# Patient Record
Sex: Male | Born: 1980
Health system: Southern US, Community
[De-identification: ages and names within clinical notes are randomized; demographics above are authoritative.]

## PROBLEM LIST (undated history)

## (undated) DIAGNOSIS — Z789 Other specified health status: Secondary | ICD-10-CM

## (undated) HISTORY — PX: WISDOM TOOTH EXTRACTION: SHX21

## (undated) HISTORY — PX: VASECTOMY: SHX75

## (undated) HISTORY — DX: Other specified health status: Z78.9

---

## 2011-02-15 ENCOUNTER — Ambulatory Visit (INDEPENDENT_AMBULATORY_CARE_PROVIDER_SITE_OTHER): Payer: BC Managed Care – PPO

## 2011-02-15 DIAGNOSIS — R509 Fever, unspecified: Secondary | ICD-10-CM

## 2011-02-15 DIAGNOSIS — R0602 Shortness of breath: Secondary | ICD-10-CM

## 2011-02-15 DIAGNOSIS — J209 Acute bronchitis, unspecified: Secondary | ICD-10-CM

## 2016-09-01 DIAGNOSIS — K602 Anal fissure, unspecified: Secondary | ICD-10-CM | POA: Insufficient documentation

## 2018-01-28 DIAGNOSIS — Z302 Encounter for sterilization: Secondary | ICD-10-CM | POA: Diagnosis not present

## 2019-05-19 ENCOUNTER — Other Ambulatory Visit: Payer: Self-pay

## 2019-05-20 ENCOUNTER — Other Ambulatory Visit: Payer: Self-pay

## 2019-05-20 ENCOUNTER — Encounter: Payer: Self-pay | Admitting: Family Medicine

## 2019-05-20 ENCOUNTER — Ambulatory Visit (INDEPENDENT_AMBULATORY_CARE_PROVIDER_SITE_OTHER): Payer: BC Managed Care – PPO | Admitting: Family Medicine

## 2019-05-20 VITALS — BP 120/82 | HR 74 | Temp 98.1°F | Ht 72.0 in | Wt 176.4 lb

## 2019-05-20 DIAGNOSIS — Z1283 Encounter for screening for malignant neoplasm of skin: Secondary | ICD-10-CM | POA: Diagnosis not present

## 2019-05-20 DIAGNOSIS — Z0000 Encounter for general adult medical examination without abnormal findings: Secondary | ICD-10-CM

## 2019-05-20 DIAGNOSIS — Z114 Encounter for screening for human immunodeficiency virus [HIV]: Secondary | ICD-10-CM

## 2019-05-20 DIAGNOSIS — Z125 Encounter for screening for malignant neoplasm of prostate: Secondary | ICD-10-CM | POA: Diagnosis not present

## 2019-05-20 LAB — CBC
HCT: 41.5 % (ref 39.0–52.0)
Hemoglobin: 14.5 g/dL (ref 13.0–17.0)
MCHC: 34.9 g/dL (ref 30.0–36.0)
MCV: 87.6 fl (ref 78.0–100.0)
Platelets: 176 10*3/uL (ref 150.0–400.0)
RBC: 4.74 Mil/uL (ref 4.22–5.81)
RDW: 12.5 % (ref 11.5–15.5)
WBC: 7.5 10*3/uL (ref 4.0–10.5)

## 2019-05-20 LAB — COMPREHENSIVE METABOLIC PANEL
ALT: 19 U/L (ref 0–53)
AST: 20 U/L (ref 0–37)
Albumin: 4.2 g/dL (ref 3.5–5.2)
Alkaline Phosphatase: 40 U/L (ref 39–117)
BUN: 27 mg/dL — ABNORMAL HIGH (ref 6–23)
CO2: 30 mEq/L (ref 19–32)
Calcium: 9 mg/dL (ref 8.4–10.5)
Chloride: 101 mEq/L (ref 96–112)
Creatinine, Ser: 1.31 mg/dL (ref 0.40–1.50)
GFR: 60.39 mL/min (ref >60.00)
Glucose, Bld: 83 mg/dL (ref 70–99)
Potassium: 3.9 mEq/L (ref 3.5–5.1)
Sodium: 138 mEq/L (ref 135–145)
Total Bilirubin: 0.5 mg/dL (ref 0.2–1.2)
Total Protein: 6.7 g/dL (ref 6.0–8.3)

## 2019-05-20 LAB — LIPID PANEL
Cholesterol: 176 mg/dL (ref 0–200)
HDL: 44.1 mg/dL (ref >39.00)
LDL Cholesterol: 99 mg/dL (ref 0–99)
NonHDL: 132.12
Total CHOL/HDL Ratio: 4
Triglycerides: 167 mg/dL — ABNORMAL HIGH (ref 0.0–149.0)
VLDL: 33.4 mg/dL (ref 0.0–40.0)

## 2019-05-20 LAB — PSA: PSA: 2.38 ng/mL (ref 0.10–4.00)

## 2019-05-20 NOTE — Patient Instructions (Addendum)
Give us 2-3 business days to get the results of your labs back.   Keep the diet clean and stay active.  If you do not hear anything about your referral in the next 1-2 weeks, call our office and ask for an update.  Let us know if you need anything. 

## 2019-05-20 NOTE — Progress Notes (Signed)
Chief Complaint  Patient presents with  . New Patient (Initial Visit)    Well Male Tyrone Preston is here for a complete physical.   His last physical was >1 year ago.  Current diet: in general, a "healthy" diet.   Current exercise: running, wt lifting, softball Weight trend: stable Daytime fatigue? No. Seat belt? Yes.    Health maintenance Tetanus- Yes HIV- No  History reviewed. No pertinent past medical history.   History reviewed. No pertinent surgical history.  Medications  No current outpatient medications on file prior to visit.   No current facility-administered medications on file prior to visit.     Allergies No Known Allergies  Family History Family History  Problem Relation Age of Onset  . Hyperlipidemia Mother   . Diabetes Mother   . Cancer Father        prostate cancer  . Cancer Paternal Grandfather        prostate cancer  . Cancer Paternal Uncle         prostate cancer    Review of Systems: Constitutional: no fevers or chills Eye:  no recent significant change in vision Ear/Nose/Mouth/Throat:  Ears:  no hearing loss Nose/Mouth/Throat:  no complaints of nasal congestion, no sore throat Cardiovascular:  no chest pain Respiratory:  no shortness of breath Gastrointestinal:  no abdominal pain, no change in bowel habits GU:  Male: negative for dysuria, frequency, and incontinence Musculoskeletal/Extremities:  no pain of the joints Integumentary (Skin/Breast):  no abnormal skin lesions reported Neurologic:  no headaches Endocrine: No unexpected weight changes Hematologic/Lymphatic:  no night sweats  Exam BP 120/82 (BP Location: Right Arm, Patient Position: Sitting, Cuff Size: Normal)   Pulse 74   Temp 98.1 F (36.7 C) (Temporal)   Ht 6' (1.829 m)   Wt 176 lb 6 oz (80 kg)   SpO2 94%   BMI 23.92 kg/m  General:  well developed, well nourished, in no apparent distress Skin:  no significant moles, warts, or growths Head:  no masses, lesions,  or tenderness Eyes:  pupils equal and round, sclera anicteric without injection Ears:  canals without lesions, TMs shiny without retraction, no obvious effusion, no erythema Nose:  nares patent, septum midline, mucosa normal Throat/Pharynx:  lips and gingiva without lesion; tongue and uvula midline; non-inflamed pharynx; no exudates or postnasal drainage Neck: neck supple without adenopathy, thyromegaly, or masses Lungs:  clear to auscultation, breath sounds equal bilaterally, no respiratory distress Cardio:  regular rate and rhythm, no bruits, no LE edema Abdomen:  abdomen soft, nontender; bowel sounds normal; no masses or organomegaly Rectal: Deferred Musculoskeletal:  symmetrical muscle groups noted without atrophy or deformity Extremities:  no clubbing, cyanosis, or edema, no deformities, no skin discoloration Neuro:  gait normal; deep tendon reflexes normal and symmetric Psych: well oriented with normal range of affect and appropriate judgment/insight  Assessment and Plan  Well adult exam - Plan: CBC, Comprehensive metabolic panel, Lipid panel  Screening for prostate cancer - Plan: PSA  Screening for HIV (human immunodeficiency virus) - Plan: HIV Antibody (routine testing w rflx)  Screening for skin cancer - Plan: Ambulatory referral to Dermatology   Well 39 y.o. male. Counseled on diet and exercise. Counseled on risks and benefits of prostate cancer screening with PSA. The patient agrees to undergo screening. +famhx w dx in mid 83's.  Other orders as above. Follow up in 1 year pending the above workup. The patient voiced understanding and agreement to the plan.  Crosby Oyster [place],  DO 05/20/19 1:57 PM

## 2019-05-21 LAB — HIV ANTIBODY (ROUTINE TESTING W REFLEX): HIV 1&2 Ab, 4th Generation: NONREACTIVE

## 2019-07-05 DIAGNOSIS — L814 Other melanin hyperpigmentation: Secondary | ICD-10-CM | POA: Diagnosis not present

## 2019-07-05 DIAGNOSIS — L812 Freckles: Secondary | ICD-10-CM | POA: Diagnosis not present

## 2019-07-05 DIAGNOSIS — D2272 Melanocytic nevi of left lower limb, including hip: Secondary | ICD-10-CM | POA: Diagnosis not present

## 2019-07-05 DIAGNOSIS — X32XXXS Exposure to sunlight, sequela: Secondary | ICD-10-CM | POA: Diagnosis not present

## 2019-08-02 ENCOUNTER — Other Ambulatory Visit: Payer: Self-pay

## 2019-08-02 ENCOUNTER — Ambulatory Visit (INDEPENDENT_AMBULATORY_CARE_PROVIDER_SITE_OTHER): Payer: BC Managed Care – PPO | Admitting: Medical

## 2019-08-02 ENCOUNTER — Encounter: Payer: Self-pay | Admitting: Medical

## 2019-08-02 VITALS — BP 139/70 | HR 73 | Temp 97.7°F | Resp 18 | Ht 72.0 in | Wt 164.0 lb

## 2019-08-02 DIAGNOSIS — Z87438 Personal history of other diseases of male genital organs: Secondary | ICD-10-CM | POA: Diagnosis not present

## 2019-08-02 DIAGNOSIS — R5383 Other fatigue: Secondary | ICD-10-CM | POA: Diagnosis not present

## 2019-08-02 DIAGNOSIS — R300 Dysuria: Secondary | ICD-10-CM

## 2019-08-02 DIAGNOSIS — R350 Frequency of micturition: Secondary | ICD-10-CM

## 2019-08-02 LAB — POC URINALSYSI DIPSTICK (AUTOMATED)
Bilirubin, UA: NEGATIVE
Blood, UA: NEGATIVE
Glucose, UA: NEGATIVE
Ketones, UA: NEGATIVE
Leukocytes, UA: NEGATIVE
Nitrite, UA: NEGATIVE
Protein, UA: NEGATIVE
Spec Grav, UA: 1.02 (ref 1.010–1.025)
Urobilinogen, UA: 0.2 E.U./dL
pH, UA: 5.5 (ref 5.0–8.0)

## 2019-08-02 MED ORDER — SULFAMETHOXAZOLE-TRIMETHOPRIM 800-160 MG PO TABS
1.0000 | ORAL_TABLET | Freq: Two times a day (BID) | ORAL | 0 refills | Status: DC
Start: 2019-08-02 — End: 2019-08-10

## 2019-08-02 NOTE — Patient Instructions (Addendum)
For hx of prostatitis and probable recurrence recently, I am prescribing Bactrim DS antibiotic.  Antibiotic choices discussed today.  Benefits versus risk.    Recommend rest and hydration.  We will get CBC, CMP, PSA and urine culture today.  Follow-up in 10 to 14 days or as needed.

## 2019-08-02 NOTE — Progress Notes (Signed)
Subjective:    Patient ID: Tyrone Preston, male    DOB: May 17, 1980, 39 y.o.   MRN: [id number]  HPI  Pt in for some urinary symptoms. Pt states he had two bout of prostatitis in his life. He states this feels similar.  At times urinates 10-12 times a day.  Pt states he is urinating very frequently. He describes some suprapubic area pain. Some perineum area pain. No fever, no chills or sweats. Pt states signs/symptosm have for about 7 days.  He states his one bout 5 years ago and another bout  2 years ago.  No hx of diabetes.       Review of Systems  Constitutional: Positive for fatigue. Negative for chills and fever.  Respiratory: Negative for cough, chest tightness, shortness of breath and wheezing.   Cardiovascular: Negative for chest pain and palpitations.  Gastrointestinal: Negative for abdominal pain.  Genitourinary: Positive for frequency. Negative for decreased urine volume, difficulty urinating, penile swelling and urgency.  Musculoskeletal: Negative for back pain.  Neurological: Negative for dizziness and headaches.  Hematological: Negative for adenopathy. Does not bruise/bleed easily.  Psychiatric/Behavioral: Negative for behavioral problems and confusion.   No past medical history on file.   Social History   Socioeconomic History  . Marital status: Married    Spouse name: Not on file  . Number of children: Not on file  . Years of education: Not on file  . Highest education level: Not on file  Occupational History  . Not on file  Tobacco Use  . Smoking status: Never Smoker  . Smokeless tobacco: Never Used  Substance and Sexual Activity  . Alcohol use: Yes    Comment: one or two per week  . Drug use: Not on file  . Sexual activity: Not on file  Other Topics Concern  . Not on file  Social History Narrative  . Not on file   Social Determinants of Health   Financial Resource Strain:   . Difficulty of Paying Living Expenses:   Food Insecurity:   .  Worried About Charity fundraiser in the Last Year:   . Arboriculturist in the Last Year:   Transportation Needs:   . Film/video editor (Medical):   Marland Kitchen Lack of Transportation (Non-Medical):   Physical Activity:   . Days of Exercise per Week:   . Minutes of Exercise per Session:   Stress:   . Feeling of Stress :   Social Connections:   . Frequency of Communication with Friends and Family:   . Frequency of Social Gatherings with Friends and Family:   . Attends Religious Services:   . Active Member of Clubs or Organizations:   . Attends Archivist Meetings:   Marland Kitchen Marital Status:   Intimate Partner Violence:   . Fear of Current or Ex-Partner:   . Emotionally Abused:   Marland Kitchen Physically Abused:   . Sexually Abused:     No past surgical history on file.  Family History  Problem Relation Age of Onset  . Hyperlipidemia Mother   . Diabetes Mother   . Cancer Father        prostate cancer  . Cancer Paternal Grandfather        prostate cancer  . Cancer Paternal Uncle         prostate cancer    No Known Allergies  No current outpatient medications on file prior to visit.   No current facility-administered medications on file prior  to visit.    BP 139/70   Pulse 73   Temp 97.7 F (36.5 C) (Temporal)   Resp 18   Ht 6' (1.829 m)   Wt 164 lb (74.4 kg)   SpO2 100%   BMI 22.24 kg/m       Objective:   Physical Exam  General- No acute distress. Pleasant patient. Neck- Full range of motion, no jvd Lungs- Clear, even and unlabored. Heart- regular rate and rhythm. Neurologic- CNII- XII grossly intact. Abdomen-soft, nontender, nondistended, positive bowel sounds, no rebound or guarding.  No organomegaly.  No suprapubic tenderness to palpation presently. Back-no CVA tenderness.        Assessment & Plan:  For hx of prostatitis and probable recurrence recently, I am prescribing Bactrim DS antibiotic.  Antibiotic choices discussed today.  Benefits versus risk.     Recommend rest and hydration.  We will get CBC, CMP, PSA and urine culture today.  Follow-up in 10 to 14 days or as needed.  Esperanza Richters, PA-C  Time spent with patient today was 20 minutes which consisted of chart review, discussing diagnosis, work up, treatment and documentation.

## 2019-08-03 LAB — URINE CULTURE
MICRO NUMBER:: 10517080
Result:: NO GROWTH
SPECIMEN QUALITY:: ADEQUATE

## 2019-08-03 LAB — COMPREHENSIVE METABOLIC PANEL
ALT: 19 U/L (ref 0–53)
AST: 22 U/L (ref 0–37)
Albumin: 4.5 g/dL (ref 3.5–5.2)
Alkaline Phosphatase: 38 U/L — ABNORMAL LOW (ref 39–117)
BUN: 26 mg/dL — ABNORMAL HIGH (ref 6–23)
CO2: 30 mEq/L (ref 19–32)
Calcium: 9 mg/dL (ref 8.4–10.5)
Chloride: 101 mEq/L (ref 96–112)
Creatinine, Ser: 1.46 mg/dL (ref 0.40–1.50)
GFR: 53.23 mL/min — ABNORMAL LOW (ref >60.00)
Glucose, Bld: 91 mg/dL (ref 70–99)
Potassium: 4.3 mEq/L (ref 3.5–5.1)
Sodium: 138 mEq/L (ref 135–145)
Total Bilirubin: 0.4 mg/dL (ref 0.2–1.2)
Total Protein: 6.7 g/dL (ref 6.0–8.3)

## 2019-08-03 LAB — CBC WITH DIFFERENTIAL/PLATELET
Basophils Absolute: 0 10*3/uL (ref 0.0–0.1)
Basophils Relative: 0.4 % (ref 0.0–3.0)
Eosinophils Absolute: 0.2 10*3/uL (ref 0.0–0.7)
Eosinophils Relative: 2.1 % (ref 0.0–5.0)
HCT: 42.2 % (ref 39.0–52.0)
Hemoglobin: 14.6 g/dL (ref 13.0–17.0)
Lymphocytes Relative: 33.9 % (ref 12.0–46.0)
Lymphs Abs: 2.5 10*3/uL (ref 0.7–4.0)
MCHC: 34.6 g/dL (ref 30.0–36.0)
MCV: 89.8 fl (ref 78.0–100.0)
Monocytes Absolute: 0.6 10*3/uL (ref 0.1–1.0)
Monocytes Relative: 8.2 % (ref 3.0–12.0)
Neutro Abs: 4.2 10*3/uL (ref 1.4–7.7)
Neutrophils Relative %: 55.4 % (ref 43.0–77.0)
Platelets: 148 10*3/uL — ABNORMAL LOW (ref 150.0–400.0)
RBC: 4.7 Mil/uL (ref 4.22–5.81)
RDW: 12.7 % (ref 11.5–15.5)
WBC: 7.5 10*3/uL (ref 4.0–10.5)

## 2019-08-03 LAB — PSA: PSA: 1.25 ng/mL (ref 0.10–4.00)

## 2019-08-10 ENCOUNTER — Other Ambulatory Visit: Payer: Self-pay

## 2019-08-10 ENCOUNTER — Encounter: Payer: Self-pay | Admitting: Family Medicine

## 2019-08-10 ENCOUNTER — Ambulatory Visit (INDEPENDENT_AMBULATORY_CARE_PROVIDER_SITE_OTHER): Payer: BC Managed Care – PPO | Admitting: Family Medicine

## 2019-08-10 VITALS — BP 122/68 | HR 98 | Temp 95.2°F | Ht 72.0 in | Wt 172.0 lb

## 2019-08-10 DIAGNOSIS — N419 Inflammatory disease of prostate, unspecified: Secondary | ICD-10-CM | POA: Diagnosis not present

## 2019-08-10 MED ORDER — CIPROFLOXACIN HCL 500 MG PO TABS: 500.0000 mg | ORAL_TABLET | Freq: Two times a day (BID) | ORAL | 0 refills | Status: DC

## 2019-08-10 NOTE — Patient Instructions (Signed)
Send me a message on Monday if you aren't better and we will set you up with a urologist.  Stay hydrated. Avoid lots of caffeine/alcohol.  Let me know if anything changes.   Let us know if you need anything.

## 2019-08-10 NOTE — Progress Notes (Signed)
Chief Complaint  Patient presents with  . Urinary Urgency    unable to complete urination  . Abdominal Pain    Subjective: Patient is a 39 y.o. male here for urinary issue.  Almost 2 weeks has been having urinary freq, urgency, incomplete emptying, lower abd pain. +hx of prostatitis. Was seen around 9 d ago and rx'd Bactrim that did not help. He was always rx'd Cipro in the past. No inj, fevers, bowel changes, discharge, dysuria.  History reviewed. No pertinent past medical history.  Objective: BP 122/68 (BP Location: Left Arm, Patient Position: Sitting, Cuff Size: Normal)   Pulse 98   Temp (!) 95.2 F (35.1 C) (Temporal)   Ht 6' (1.829 m)   Wt 172 lb (78 kg)   SpO2 100%   BMI 23.33 kg/m  General: Awake, appears stated age HEENT: MMM, EOMi Heart: RRR Abd: BS+, S, mild ttp in suprapubic region, ND GU: No ttp over testicles, no erythema or drainage Lungs: CTAB, no rales, wheezes or rhonchi. No accessory muscle use Psych: Age appropriate judgment and insight, normal affect and mood  Assessment and Plan: Prostatitis, unspecified prostatitis type - Plan: ciprofloxacin (CIPRO) 500 MG tablet  Stop Bactrim. Start 10 d course of bid Cipro. If no improvement in 5 d, he will message and we will refer to urology. If new s/s's arise, he will let us know.  F/u as originally scheduled otherwise.  The patient voiced understanding and agreement to the plan.  Jilda Roche [place], DO 08/10/19  2:21 PM

## 2019-08-19 DIAGNOSIS — R102 Pelvic and perineal pain: Secondary | ICD-10-CM | POA: Diagnosis not present

## 2019-08-19 DIAGNOSIS — R350 Frequency of micturition: Secondary | ICD-10-CM | POA: Diagnosis not present

## 2019-08-19 DIAGNOSIS — N411 Chronic prostatitis: Secondary | ICD-10-CM | POA: Diagnosis not present

## 2019-09-14 DIAGNOSIS — N411 Chronic prostatitis: Secondary | ICD-10-CM | POA: Diagnosis not present

## 2019-09-14 DIAGNOSIS — R350 Frequency of micturition: Secondary | ICD-10-CM | POA: Diagnosis not present

## 2019-09-14 DIAGNOSIS — R102 Pelvic and perineal pain: Secondary | ICD-10-CM | POA: Diagnosis not present

## 2019-09-16 ENCOUNTER — Ambulatory Visit (INDEPENDENT_AMBULATORY_CARE_PROVIDER_SITE_OTHER): Payer: BC Managed Care – PPO | Admitting: Family Medicine

## 2019-09-16 ENCOUNTER — Other Ambulatory Visit: Payer: Self-pay

## 2019-09-16 ENCOUNTER — Encounter: Payer: Self-pay | Admitting: Family Medicine

## 2019-09-16 VITALS — BP 108/68 | HR 70 | Temp 98.0°F | Ht 72.0 in | Wt 167.2 lb

## 2019-09-16 DIAGNOSIS — S46811A Strain of other muscles, fascia and tendons at shoulder and upper arm level, right arm, initial encounter: Secondary | ICD-10-CM | POA: Diagnosis not present

## 2019-09-16 MED ORDER — CYCLOBENZAPRINE HCL 10 MG PO TABS: 5.0000 mg | ORAL_TABLET | Freq: Three times a day (TID) | ORAL | 0 refills | Status: DC | PRN

## 2019-09-16 NOTE — Progress Notes (Signed)
Musculoskeletal Exam  Patient: Tyrone Preston DOB: 11-12-1980  DOS: 09/16/2019  SUBJECTIVE:  Chief Complaint:   Chief Complaint  Patient presents with  . Neck Pain    3 weeks    [name] is a 39 y.o.  male for evaluation and treatment of neck pain.   Onset:  3 weeks ago. Was doing shrugs  Location: R trap Character:  aching and sharp  Progression of issue: Got better then worse again Associated symptoms: no bruising, swelling, redness, ROM of neck better Treatment: to date has been rest, OTC NSAIDS and heat.   Neurovascular symptoms: no  History reviewed. No pertinent past medical history.  Objective: VITAL SIGNS: BP 108/68 (BP Location: Left Arm, Patient Position: Sitting, Cuff Size: Normal)   Pulse 70   Temp 98 F (36.7 C) (Oral)   Ht 6' (1.829 m)   Wt 167 lb 4 oz (75.9 kg)   SpO2 100%   BMI 22.68 kg/m  Constitutional: Well formed, well developed. No acute distress. Thorax & Lungs: No accessory muscle use Musculoskeletal: R trap.   Normal active range of motion: yes.   Normal passive range of motion: yes Tenderness to palpation: yes over medial trap near T2-3 Deformity: no Ecchymosis: no Neurologic: Normal sensory function. No focal deficits noted. DTR's equal and symmetric in UE's. No clonus. 5/5 strength throughout in UE's Psychiatric: Normal mood. Age appropriate judgment and insight. Alert & oriented x 3.    Assessment:  Strain of right trapezius muscle, initial encounter - Plan: cyclobenzaprine (FLEXERIL) 10 MG tablet  Plan: Orders as above. Heat, ice, Tylenol, stretches/exercises. PT in 3-4 weeks if no better. F/u prn. The patient voiced understanding and agreement to the plan.   Tyrone Roche [place], DO 09/16/19  10:14 AM

## 2019-09-16 NOTE — Patient Instructions (Addendum)
Heat (pad or rice pillow in microwave) over affected area, 10-15 minutes twice daily.   Ice/cold pack over area for 10-15 min twice daily.   OK to take Tylenol 1000 mg (2 extra strength tabs) or 975 mg (3 regular strength tabs) every 6 hours as needed.  Take Flexeril (cyclobenzaprine) 1-2 hours before planned bedtime. If it makes you drowsy, do not take during the day. You can try half a tab the following night.   Send me a message in 3-4 weeks if we aren't turning the corner and we will set you up with physical therapy.   Let us know if you need anything.  Trapezius stretches/exercises Do exercises exactly as told by your health care provider and adjust them as directed. It is normal to feel mild stretching, pulling, tightness, or discomfort as you do these exercises, but you should stop right away if you feel sudden pain or your pain gets worse.  Stretching and range of motion exercises These exercises warm up your muscles and joints and improve the movement and flexibility of your shoulder. These exercises can also help to relieve pain, numbness, and tingling. If you are unable to do any of the following for any reason, do not further attempt to do it.   Exercise A: Flexion, standing    1. Stand and hold a broomstick, a cane, or a similar object. Place your hands a little more than shoulder-width apart on the object. Your left / right hand should be palm-up, and your other hand should be palm-down. 2. Push the stick to raise your left / right arm out to your side and then over your head. Use your other hand to help move the stick. Stop when you feel a stretch in your shoulder, or when you reach the angle that is recommended by your health care provider. ? Avoid shrugging your shoulder while you raise your arm. Keep your shoulder blade tucked down toward your spine. 3. Hold for 30 seconds. 4. Slowly return to the starting position. Repeat 2 times. Complete this exercise 3 times per  week.  Exercise B: Abduction, supine    1. Lie on your back and hold a broomstick, a cane, or a similar object. Place your hands a little more than shoulder-width apart on the object. Your left / right hand should be palm-up, and your other hand should be palm-down. 2. Push the stick to raise your left / right arm out to your side and then over your head. Use your other hand to help move the stick. Stop when you feel a stretch in your shoulder, or when you reach the angle that is recommended by your health care provider. ? Avoid shrugging your shoulder while you raise your arm. Keep your shoulder blade tucked down toward your spine. 3. Hold for 30 seconds. 4. Slowly return to the starting position. Repeat 2 times. Complete this exercise 3 times per week.  Exercise C: Flexion, active-assisted    1. Lie on your back. You may bend your knees for comfort. 2. Hold a broomstick, a cane, or a similar object. Place your hands about shoulder-width apart on the object. Your palms should face toward your feet. 3. Raise the stick and move your arms over your head and behind your head, toward the floor. Use your healthy arm to help your left / right arm move farther. Stop when you feel a gentle stretch in your shoulder, or when you reach the angle where your health care provider tells  you to stop. 4. Hold for 30 seconds. 5. Slowly return to the starting position. Repeat 2 times. Complete this exercise 3 times per week.  Exercise D: External rotation and abduction    1. Stand in a door frame with one of your feet slightly in front of the other. This is called a staggered stance. 2. Choose one of the following positions as told by your health care provider: ? Place your hands and forearms on the door frame above your head. ? Place your hands and forearms on the door frame at the height of your head. ? Place your hands on the door frame at the height of your elbows. 3. Slowly move your weight onto  your front foot until you feel a stretch across your chest and in the front of your shoulders. Keep your head and chest upright and keep your abdominal muscles tight. 4. Hold for 30 seconds. 5. To release the stretch, shift your weight to your back foot. Repeat 2 times. Complete this stretch 3 times per week.  Strengthening exercises These exercises build strength and endurance in your shoulder. Endurance is the ability to use your muscles for a long time, even after your muscles get tired. Exercise E: Scapular depression and adduction  1. Sit on a stable chair. Support your arms in front of you with pillows, armrests, or a tabletop. Keep your elbows in line with the sides of your body. 2. Gently move your shoulder blades down toward your middle back. Relax the muscles on the tops of your shoulders and in the back of your neck. 3. Hold for 3 seconds. 4. Slowly release the tension and relax your muscles completely before doing this exercise again. Repeat for a total of 10 repetitions. 5. After you have practiced this exercise, try doing the exercise without the arm support. Then, try the exercise while standing instead of sitting. Repeat 2 times. Complete this exercise 3 times per week.  Exercise F: Shoulder abduction, isometric    1. Stand or sit about 4-6 inches (10-15 cm) from a wall with your left / right side facing the wall. 2. Bend your left / right elbow and gently press your elbow against the wall. 3. Increase the pressure slowly until you are pressing as hard as you can without shrugging your shoulder. 4. Hold for 3 seconds. 5. Slowly release the tension and relax your muscles completely. Repeat for a total of 10 repetitions. Repeat 2 times. Complete this exercise 3 times per week.  Exercise G: Shoulder flexion, isometric    1. Stand or sit about 4-6 inches (10-15 cm) away from a wall with your left / right side facing the wall. 2. Keep your left / right elbow straight and  gently press the top of your fist against the wall. Increase the pressure slowly until you are pressing as hard as you can without shrugging your shoulder. 3. Hold for 10-15 seconds. 4. Slowly release the tension and relax your muscles completely. Repeat for a total of 10 repetitions. Repeat 2 times. Complete this exercise 3 times per week.  Exercise H: Internal rotation    1. Sit in a stable chair without armrests, or stand. Secure an exercise band at your left / right side, at elbow height. 2. Place a soft object, such as a folded towel or a small pillow, under your left / right upper arm so your elbow is a few inches (about 8 cm) away from your side. 3. Hold the end of  the exercise band so the band stretches. 4. Keeping your elbow pressed against the soft object under your arm, move your forearm across your body toward your abdomen. Keep your body steady so the movement is only coming from your shoulder. 5. Hold for 3 seconds. 6. Slowly return to the starting position. Repeat for a total of 10 repetitions. Repeat 2 times. Complete this exercise 3 times per week.  Exercise I: External rotation    1. Sit in a stable chair without armrests, or stand. 2. Secure an exercise band at your left / right side, at elbow height. 3. Place a soft object, such as a folded towel or a small pillow, under your left / right upper arm so your elbow is a few inches (about 8 cm) away from your side. 4. Hold the end of the exercise band so the band stretches. 5. Keeping your elbow pressed against the soft object under your arm, move your forearm out, away from your abdomen. Keep your body steady so the movement is only coming from your shoulder. 6. Hold for 3 seconds. 7. Slowly return to the starting position. Repeat for a total of 10 repetitions. Repeat 2 times. Complete this exercise 3 times per week. Exercise J: Shoulder extension  1. Sit in a stable chair without armrests, or stand. Secure an exercise  band to a stable object in front of you so the band is at shoulder height. 2. Hold one end of the exercise band in each hand. Your palms should face each other. 3. Straighten your elbows and lift your hands up to shoulder height. 4. Step back, away from the secured end of the exercise band, until the band stretches. 5. Squeeze your shoulder blades together and pull your hands down to the sides of your thighs. Stop when your hands are straight down by your sides. Do not let your hands go behind your body. 6. Hold for 3 seconds. 7. Slowly return to the starting position. Repeat for a total of 10 repetitions. Repeat 2 times. Complete this exercise 3 times per week.  Exercise K: Shoulder extension, prone    1. Lie on your abdomen on a firm surface so your left / right arm hangs over the edge. 2. Hold a 5 lb weight in your hand so your palm faces in toward your body. Your arm should be straight. 3. Squeeze your shoulder blade down toward the middle of your back. 4. Slowly raise your arm behind you, up to the height of the surface that you are lying on. Keep your arm straight. 5. Hold for 3 seconds. 6. Slowly return to the starting position and relax your muscles. Repeat for a total of 10 repetitions. Repeat 2 times. Complete this exercise 3 times per week.   Exercise L: Horizontal abduction, prone  1. Lie on your abdomen on a firm surface so your left / right arm hangs over the edge. 2. Hold a 5 lb weight in your hand so your palm faces toward your feet. Your arm should be straight. 3. Squeeze your shoulder blade down toward the middle of your back. 4. Bend your elbow so your hand moves up, until your elbow is bent to an "L" shape (90 degrees). With your elbow bent, slowly move your forearm forward and up. Raise your hand up to the height of the surface that you are lying on. ? Your upper arm should not move, and your elbow should stay bent. ? At the top of the movement,  your palm should face  the floor. 5. Hold for 3 seconds. 6. Slowly return to the starting position and relax your muscles. Repeat for a total of 10 repetitions. Repeat 2 times. Complete this exercise 3 times per week.  Exercise M: Horizontal abduction, standing  1. Sit on a stable chair, or stand. 2. Secure an exercise band to a stable object in front of you so the band is at shoulder height. 3. Hold one end of the exercise band in each hand. 4. Straighten your elbows and lift your hands straight in front of you, up to shoulder height. Your palms should face down, toward the floor. 5. Step back, away from the secured end of the exercise band, until the band stretches. 6. Move your arms out to your sides, and keep your arms straight. 7. Hold for 3 seconds. 8. Slowly return to the starting position. Repeat for a total of 10 repetitions. Repeat 2 times. Complete this exercise 3 times per week.  Exercise N: Scapular retraction and elevation  1. Sit on a stable chair, or stand. 2. Secure an exercise band to a stable object in front of you so the band is at shoulder height. 3. Hold one end of the exercise band in each hand. Your palms should face each other. 4. Sit in a stable chair without armrests, or stand. 5. Step back, away from the secured end of the exercise band, until the band stretches. 6. Squeeze your shoulder blades together and lift your hands over your head. Keep your elbows straight. 7. Hold for 3 seconds. 8. Slowly return to the starting position. Repeat for a total of 10 repetitions. Repeat 2 times. Complete this exercise 3 times per week.  This information is not intended to replace advice given to you by your health care provider. Make sure you discuss any questions you have with your health care provider. Document Released: 02/24/2005 Document Revised: 11/01/2015 Document Reviewed: 01/11/2015 Elsevier Interactive Patient Education  2017 [employer].

## 2019-09-22 DIAGNOSIS — N411 Chronic prostatitis: Secondary | ICD-10-CM | POA: Diagnosis not present

## 2019-09-22 DIAGNOSIS — M62838 Other muscle spasm: Secondary | ICD-10-CM | POA: Diagnosis not present

## 2019-09-22 DIAGNOSIS — M6289 Other specified disorders of muscle: Secondary | ICD-10-CM | POA: Diagnosis not present

## 2019-09-22 DIAGNOSIS — M6281 Muscle weakness (generalized): Secondary | ICD-10-CM | POA: Diagnosis not present

## 2019-10-10 DIAGNOSIS — N411 Chronic prostatitis: Secondary | ICD-10-CM | POA: Diagnosis not present

## 2019-10-10 DIAGNOSIS — R350 Frequency of micturition: Secondary | ICD-10-CM | POA: Diagnosis not present

## 2019-10-10 DIAGNOSIS — Z302 Encounter for sterilization: Secondary | ICD-10-CM | POA: Diagnosis not present

## 2019-10-10 DIAGNOSIS — R102 Pelvic and perineal pain: Secondary | ICD-10-CM | POA: Diagnosis not present

## 2019-10-31 DIAGNOSIS — R102 Pelvic and perineal pain: Secondary | ICD-10-CM | POA: Diagnosis not present

## 2019-10-31 DIAGNOSIS — M6289 Other specified disorders of muscle: Secondary | ICD-10-CM | POA: Diagnosis not present

## 2019-10-31 DIAGNOSIS — M6281 Muscle weakness (generalized): Secondary | ICD-10-CM | POA: Diagnosis not present

## 2019-10-31 DIAGNOSIS — M62838 Other muscle spasm: Secondary | ICD-10-CM | POA: Diagnosis not present

## 2019-11-23 DIAGNOSIS — Z20822 Contact with and (suspected) exposure to covid-19: Secondary | ICD-10-CM | POA: Diagnosis not present

## 2019-12-08 DIAGNOSIS — B078 Other viral warts: Secondary | ICD-10-CM | POA: Diagnosis not present

## 2019-12-08 DIAGNOSIS — L821 Other seborrheic keratosis: Secondary | ICD-10-CM | POA: Diagnosis not present

## 2019-12-13 DIAGNOSIS — R102 Pelvic and perineal pain: Secondary | ICD-10-CM | POA: Diagnosis not present

## 2019-12-13 DIAGNOSIS — R1084 Generalized abdominal pain: Secondary | ICD-10-CM | POA: Diagnosis not present

## 2020-04-18 ENCOUNTER — Ambulatory Visit (INDEPENDENT_AMBULATORY_CARE_PROVIDER_SITE_OTHER): Payer: BC Managed Care – PPO | Admitting: Family Medicine

## 2020-04-18 ENCOUNTER — Encounter: Payer: Self-pay | Admitting: Family Medicine

## 2020-04-18 ENCOUNTER — Other Ambulatory Visit: Payer: Self-pay

## 2020-04-18 VITALS — BP 110/76 | HR 59 | Temp 98.2°F | Ht 72.0 in | Wt 180.0 lb

## 2020-04-18 DIAGNOSIS — Z125 Encounter for screening for malignant neoplasm of prostate: Secondary | ICD-10-CM | POA: Diagnosis not present

## 2020-04-18 DIAGNOSIS — Z0000 Encounter for general adult medical examination without abnormal findings: Secondary | ICD-10-CM

## 2020-04-18 DIAGNOSIS — Z1159 Encounter for screening for other viral diseases: Secondary | ICD-10-CM

## 2020-04-18 DIAGNOSIS — Z1331 Encounter for screening for depression: Secondary | ICD-10-CM

## 2020-04-18 NOTE — Progress Notes (Signed)
Chief Complaint  Patient presents with  . Annual Exam    Well Male Tyrone Preston is here for a complete physical.   His last physical was >1 year ago.  Current diet: in general, a "healthy" diet.   Current exercise: lifting, cardio, stretching Weight trend: stable Fatigue out of ordinary? No. Seat belt? Yes.   Loss of interested in doing things or depression in past 2 weeks? No  Health maintenance Tetanus- Yes HIV- Yes Hep C- No  Past Medical History:  Diagnosis Date  . No known health problems      History reviewed. No pertinent surgical history.  Medications  Takes no meds routinely.    Allergies No Known Allergies  Family History Family History  Problem Relation Age of Onset  . Hyperlipidemia Mother   . Diabetes Mother   . Cancer Father        prostate cancer  . Cancer Paternal Grandfather        prostate cancer  . Cancer Paternal Uncle         prostate cancer    Review of Systems: Constitutional: no fevers or chills Eye:  no recent significant change in vision Ear/Nose/Mouth/Throat:  Ears:  no hearing loss Nose/Mouth/Throat:  no complaints of nasal congestion, no sore throat Cardiovascular:  no chest pain Respiratory:  no shortness of breath Gastrointestinal:  no abdominal pain, no change in bowel habits GU:  Male: negative for dysuria, frequency, and incontinence Musculoskeletal/Extremities:  No new pain of the joints Integumentary (Skin/Breast):  no abnormal skin lesions reported Neurologic:  no headaches Endocrine: No unexpected weight changes Hematologic/Lymphatic:  no night sweats  Exam BP 110/76 (BP Location: Left Arm, Patient Position: Sitting, Cuff Size: Normal)   Pulse (!) 59   Temp 98.2 F (36.8 C) (Oral)   Ht 6' (1.829 m)   Wt 180 lb (81.6 kg)   SpO2 99%   BMI 24.41 kg/m  General:  well developed, well nourished, in no apparent distress Skin:  no significant moles, warts, or growths Head:  no masses, lesions, or  tenderness Eyes:  pupils equal and round, sclera anicteric without injection Ears:  canals without lesions, TMs shiny without retraction, no obvious effusion, no erythema Nose:  nares patent, septum midline, mucosa normal Throat/Pharynx:  lips and gingiva without lesion; tongue and uvula midline; non-inflamed pharynx; no exudates or postnasal drainage Neck: neck supple without adenopathy, thyromegaly, or masses Lungs:  clear to auscultation, breath sounds equal bilaterally, no respiratory distress Cardio:  regular rate and rhythm, no bruits, no LE edema Abdomen:  abdomen soft, nontender; bowel sounds normal; no masses or organomegaly Rectal: Deferred Musculoskeletal:  symmetrical muscle groups noted without atrophy or deformity Extremities:  no clubbing, cyanosis, or edema, no deformities, no skin discoloration Neuro:  gait normal; deep tendon reflexes normal and symmetric Psych: well oriented with normal range of affect and appropriate judgment/insight  Assessment and Plan  Well adult exam - Plan: Lipid panel, Comprehensive metabolic panel  Screening for prostate cancer - Plan: PSA  Encounter for hepatitis C screening test for low risk patient - Plan: Hepatitis C antibody  Negative depression screening   Well 40 y.o. male. Counseled on diet and exercise. Counseled on risks and benefits of prostate cancer screening with PSA. The patient agrees to undergo screening.   Declines covid vaccination. I rec'd he does get it. He will let me know if he has questions about it.  Other orders as above. Follow up in 1 yr pending the  above workup. The patient voiced understanding and agreement to the plan.  Jilda Roche [place], DO 04/18/20 2:57 PM

## 2020-04-18 NOTE — Patient Instructions (Signed)
Give [place] 2-3 business days to get the results of your labs back.   Keep the diet clean and stay active.  I do recommend you get the COVID-19 vaccination, either Pfizer or Moderna. Please don't hesitate to reach out if you have any questions.   Let us know if you need anything.

## 2020-04-19 LAB — COMPREHENSIVE METABOLIC PANEL
ALT: 19 U/L (ref 0–53)
AST: 21 U/L (ref 0–37)
Albumin: 4.5 g/dL (ref 3.5–5.2)
Alkaline Phosphatase: 42 U/L (ref 39–117)
BUN: 27 mg/dL — ABNORMAL HIGH (ref 6–23)
CO2: 30 mEq/L (ref 19–32)
Calcium: 9.4 mg/dL (ref 8.4–10.5)
Chloride: 102 mEq/L (ref 96–112)
Creatinine, Ser: 1.36 mg/dL (ref 0.40–1.50)
GFR: 64.59 mL/min (ref >60.00)
Glucose, Bld: 85 mg/dL (ref 70–99)
Potassium: 3.8 mEq/L (ref 3.5–5.1)
Sodium: 139 mEq/L (ref 135–145)
Total Bilirubin: 0.4 mg/dL (ref 0.2–1.2)
Total Protein: 6.9 g/dL (ref 6.0–8.3)

## 2020-04-19 LAB — LIPID PANEL
Cholesterol: 199 mg/dL (ref 0–200)
HDL: 47.4 mg/dL (ref >39.00)
LDL Cholesterol: 119 mg/dL — ABNORMAL HIGH (ref 0–99)
NonHDL: 151.66
Total CHOL/HDL Ratio: 4
Triglycerides: 162 mg/dL — ABNORMAL HIGH (ref 0.0–149.0)
VLDL: 32.4 mg/dL (ref 0.0–40.0)

## 2020-04-19 LAB — PSA: PSA: 2.13 ng/mL (ref 0.10–4.00)

## 2020-04-19 LAB — HEPATITIS C ANTIBODY
Hepatitis C Ab: NONREACTIVE
SIGNAL TO CUT-OFF: 0.01 (ref <1.00)

## 2020-05-16 DIAGNOSIS — M6281 Muscle weakness (generalized): Secondary | ICD-10-CM | POA: Diagnosis not present

## 2020-05-16 DIAGNOSIS — R102 Pelvic and perineal pain: Secondary | ICD-10-CM | POA: Diagnosis not present

## 2020-05-16 DIAGNOSIS — M62838 Other muscle spasm: Secondary | ICD-10-CM | POA: Diagnosis not present

## 2020-05-16 DIAGNOSIS — R350 Frequency of micturition: Secondary | ICD-10-CM | POA: Diagnosis not present

## 2020-06-06 DIAGNOSIS — R102 Pelvic and perineal pain: Secondary | ICD-10-CM | POA: Diagnosis not present

## 2020-06-06 DIAGNOSIS — R350 Frequency of micturition: Secondary | ICD-10-CM | POA: Diagnosis not present

## 2020-06-06 DIAGNOSIS — M62838 Other muscle spasm: Secondary | ICD-10-CM | POA: Diagnosis not present

## 2020-06-06 DIAGNOSIS — M6281 Muscle weakness (generalized): Secondary | ICD-10-CM | POA: Diagnosis not present

## 2020-07-03 DIAGNOSIS — L814 Other melanin hyperpigmentation: Secondary | ICD-10-CM | POA: Diagnosis not present

## 2020-07-03 DIAGNOSIS — D1801 Hemangioma of skin and subcutaneous tissue: Secondary | ICD-10-CM | POA: Diagnosis not present

## 2020-07-03 DIAGNOSIS — X32XXXS Exposure to sunlight, sequela: Secondary | ICD-10-CM | POA: Diagnosis not present

## 2020-07-03 DIAGNOSIS — D2272 Melanocytic nevi of left lower limb, including hip: Secondary | ICD-10-CM | POA: Diagnosis not present

## 2020-08-28 DIAGNOSIS — N411 Chronic prostatitis: Secondary | ICD-10-CM | POA: Diagnosis not present

## 2020-08-28 DIAGNOSIS — M6281 Muscle weakness (generalized): Secondary | ICD-10-CM | POA: Diagnosis not present

## 2020-08-28 DIAGNOSIS — R350 Frequency of micturition: Secondary | ICD-10-CM | POA: Diagnosis not present

## 2020-08-28 DIAGNOSIS — R102 Pelvic and perineal pain: Secondary | ICD-10-CM | POA: Diagnosis not present

## 2021-04-19 ENCOUNTER — Encounter: Payer: Self-pay | Admitting: Family Medicine

## 2021-04-19 ENCOUNTER — Ambulatory Visit (INDEPENDENT_AMBULATORY_CARE_PROVIDER_SITE_OTHER): Payer: BC Managed Care – PPO | Admitting: Family Medicine

## 2021-04-19 VITALS — BP 120/70 | HR 61 | Temp 98.6°F | Ht 72.0 in | Wt 175.4 lb

## 2021-04-19 DIAGNOSIS — Z125 Encounter for screening for malignant neoplasm of prostate: Secondary | ICD-10-CM | POA: Diagnosis not present

## 2021-04-19 DIAGNOSIS — Z0000 Encounter for general adult medical examination without abnormal findings: Secondary | ICD-10-CM | POA: Diagnosis not present

## 2021-04-19 DIAGNOSIS — M79601 Pain in right arm: Secondary | ICD-10-CM | POA: Diagnosis not present

## 2021-04-19 NOTE — Progress Notes (Signed)
Chief Complaint  Patient presents with   Annual Exam    Tyrone Preston Tyrone Preston is here for a complete physical.   His last physical was >1 year ago.  Current diet: in general, a "healthy" diet.   Current exercise: lifting, cardio Weight trend: stable Fatigue out of ordinary? No. Seat belt? Yes.   Advanced directive? No  Health maintenance Tetanus- Yes HIV- Yes Hep C- Yes  R shoulder/upper arm pain Pain for nearly 1 yr, no specific inj or change in activity. No bruising, swelling, redness. Press exercises and reaching back causes pain. No catching/locking.   Past Medical History:  Diagnosis Date   No known health problems      Past Surgical History:  Procedure Laterality Date   VASECTOMY     WISDOM TOOTH EXTRACTION      Medications  Takes no meds routinely.     Allergies No Known Allergies  Family History Family History  Problem Relation Age of Onset   Hyperlipidemia Mother    Diabetes Mother    Cancer Father        prostate cancer   Cancer Paternal Grandfather        prostate cancer   Cancer Paternal Uncle         prostate cancer    Review of Systems: Constitutional: no fevers or chills Eye:  no recent significant change in vision Ear/Nose/Mouth/Throat:  Ears:  no hearing loss Nose/Mouth/Throat:  no complaints of nasal congestion, no sore throat Cardiovascular:  no chest pain Respiratory:  no shortness of breath Gastrointestinal:  no abdominal pain, no change in bowel habits GU:  Preston: negative for dysuria, frequency, and incontinence Musculoskeletal/Extremities:  +arm pain Integumentary (Skin/Breast):  no abnormal skin lesions reported Neurologic:  no headaches Endocrine: No unexpected weight changes Hematologic/Lymphatic:  no night sweats  Exam BP 120/70    Pulse 61    Temp 98.6 F (37 C) (Oral)    Ht 6' (1.829 m)    Wt 175 lb 6 oz (79.5 kg)    SpO2 99%    BMI 23.79 kg/m  General:  Tyrone developed, Tyrone nourished, in no apparent  distress Skin:  no significant moles, warts, or growths Head:  no masses, lesions, or tenderness Eyes:  pupils equal and round, sclera anicteric without injection Ears:  canals without lesions, TMs shiny without retraction, no obvious effusion, no erythema Nose:  nares patent, septum midline, mucosa normal Throat/Pharynx:  lips and gingiva without lesion; tongue and uvula midline; non-inflamed pharynx; no exudates or postnasal drainage Neck: neck supple without adenopathy, thyromegaly, or masses Lungs:  clear to auscultation, breath sounds equal bilaterally, no respiratory distress Cardio:  regular rate and rhythm, no bruits, no LE edema Abdomen:  abdomen soft, nontender; bowel sounds normal; no masses or organomegaly Rectal: Deferred Musculoskeletal:  RUE: No ttp, nml active/passive ROM, no ecchymosis or deformity, neg Neer's, Hawkins, lift off, cross over, Obriens, Speed's, Empty can caused pain but no weakness Extremities:  no clubbing, cyanosis, or edema, no deformities, no skin discoloration Neuro:  gait normal; deep tendon reflexes normal and symmetric Psych: Tyrone oriented with normal range of affect and appropriate judgment/insight  Assessment and Plan  Tyrone adult exam - Plan: CBC, Comprehensive metabolic panel, Lipid panel  Screening for prostate cancer - Plan: PSA  Right arm pain - Plan: Ambulatory referral to Sports Medicine   Tyrone Preston. Counseled on diet and exercise. Counseled on risks and benefits of prostate cancer screening with PSA. The  patient agrees to undergo screening.  R arm pain: Will refer to sports med, heat, ice, Tylenol, stretches/exercises for shoulder region.  Advanced directive form provided today.  Other orders as above. Follow up in 1 yr pending the above workup. The patient voiced understanding and agreement to the plan.  Jilda Roche [place], DO 04/19/21 2:58 PM

## 2021-04-19 NOTE — Patient Instructions (Addendum)
Give [place] 2-3 business days to get the results of your labs back.   Keep the diet clean and stay active.  If you do not hear anything about your referral in the next 1-2 weeks, call our office and ask for an update.  Heat (pad or rice pillow in microwave) over affected area, 10-15 minutes twice daily.   Ice/cold pack over area for 10-15 min twice daily.  Let us know if you need anything.  EXERCISES  RANGE OF MOTION (ROM) AND STRETCHING EXERCISES These exercises may help you when beginning to rehabilitate your injury. While completing these exercises, remember:  Restoring tissue flexibility helps normal motion to return to the joints. This allows healthier, less painful movement and activity. An effective stretch should be held for at least 30 seconds. A stretch should never be painful. You should only feel a gentle lengthening or release in the stretched tissue.  ROM - Pendulum Bend at the waist so that your right / left arm falls away from your body. Support yourself with your opposite hand on a solid surface, such as a table or a countertop. Your right / left arm should be perpendicular to the ground. If it is not perpendicular, you need to lean over farther. Relax the muscles in your right / left arm and shoulder as much as possible. Gently sway your hips and trunk so they move your right / left arm without any use of your right / left shoulder muscles. Progress your movements so that your right / left arm moves side to side, then forward and backward, and finally, both clockwise and counterclockwise. Complete 10-15 repetitions in each direction. Many people use this exercise to relieve discomfort in their shoulder as well as to gain range of motion. Repeat 2 times. Complete this exercise 3 times per week.  STRETCH - Flexion, Standing Stand with good posture. With an underhand grip on your right / left hand and an overhand grip on the opposite hand, grasp a broomstick or cane so that  your hands are a little more than shoulder-width apart. Keeping your right / left elbow straight and shoulder muscles relaxed, push the stick with your opposite hand to raise your right / left arm in front of your body and then overhead. Raise your arm until you feel a stretch in your right / left shoulder, but before you have increased shoulder pain. Try to avoid shrugging your right / left shoulder as your arm rises by keeping your shoulder blade tucked down and toward your mid-back spine. Hold 30 seconds. Slowly return to the starting position. Repeat 2 times. Complete this exercise 3 times per week.  STRETCH - Internal Rotation Place your right / left hand behind your back, palm-up. Throw a towel or belt over your opposite shoulder. Grasp the towel/belt with your right / left hand. While keeping an upright posture, gently pull up on the towel/belt until you feel a stretch in the front of your right / left shoulder. Avoid shrugging your right / left shoulder as your arm rises by keeping your shoulder blade tucked down and toward your mid-back spine. Hold 30. Release the stretch by lowering your opposite hand. Repeat 2 times. Complete this exercise 3 times per week.  STRETCH - External Rotation and Abduction Stagger your stance through a doorframe. It does not matter which foot is forward. As instructed by your physician, physical therapist or athletic trainer, place your hands: And forearms above your head and on the door frame. And  forearms at head-height and on the door frame. At elbow-height and on the door frame. Keeping your head and chest upright and your stomach muscles tight to prevent over-extending your low-back, slowly shift your weight onto your front foot until you feel a stretch across your chest and/or in the front of your shoulders. Hold 30 seconds. Shift your weight to your back foot to release the stretch. Repeat 2 times. Complete this stretch 3 times per week.    STRENGTHENING EXERCISES  These exercises may help you when beginning to rehabilitate your injury. They may resolve your symptoms with or without further involvement from your physician, physical therapist or athletic trainer. While completing these exercises, remember:  Muscles can gain both the endurance and the strength needed for everyday activities through controlled exercises. Complete these exercises as instructed by your physician, physical therapist or athletic trainer. Progress the resistance and repetitions only as guided. You may experience muscle soreness or fatigue, but the pain or discomfort you are trying to eliminate should never worsen during these exercises. If this pain does worsen, stop and make certain you are following the directions exactly. If the pain is still present after adjustments, discontinue the exercise until you can discuss the trouble with your clinician. If advised by your physician, during your recovery, avoid activity or exercises which involve actions that place your right / left hand or elbow above your head or behind your back or head. These positions stress the tissues which are trying to heal.  STRENGTH - Scapular Depression and Adduction With good posture, sit on a firm chair. Supported your arms in front of you with pillows, arm rests or a table top. Have your elbows in line with the sides of your body. Gently draw your shoulder blades down and toward your mid-back spine. Gradually increase the tension without tensing the muscles along the top of your shoulders and the back of your neck. Hold for 3 seconds. Slowly release the tension and relax your muscles completely before completing the next repetition. After you have practiced this exercise, remove the arm support and complete it in standing as well as sitting. Repeat 2 times. Complete this exercise 3 times per week.   STRENGTH - External Rotators Secure a rubber exercise band/tubing to a fixed  object so that it is at the same height as your right / left elbow when you are standing or sitting on a firm surface. Stand or sit so that the secured exercise band/tubing is at your side that is not injured. Bend your elbow 90 degrees. Place a folded towel or small pillow under your right / left arm so that your elbow is a few inches away from your side. Keeping the tension on the exercise band/tubing, pull it away from your body, as if pivoting on your elbow. Be sure to keep your body steady so that the movement is only coming from your shoulder rotating. Hold 3 seconds. Release the tension in a controlled manner as you return to the starting position. Repeat 2 times. Complete this exercise 3 times per week.   STRENGTH - Supraspinatus Stand or sit with good posture. Grasp a 2-3 lb weight or an exercise band/tubing so that your hand is "thumbs-up," like when you shake hands. Slowly lift your right / left hand from your thigh into the air, traveling about 30 degrees from straight out at your side. Lift your hand to shoulder height or as far as you can without increasing any shoulder pain. Initially, many  people do not lift their hands above shoulder height. Avoid shrugging your right / left shoulder as your arm rises by keeping your shoulder blade tucked down and toward your mid-back spine. Hold for 3 seconds. Control the descent of your hand as you slowly return to your starting position. Repeat 2 times. Complete this exercise 3 times per week.   STRENGTH - Shoulder Extensors Secure a rubber exercise band/tubing so that it is at the height of your shoulders when you are either standing or sitting on a firm arm-less chair. With a thumbs-up grip, grasp an end of the band/tubing in each hand. Straighten your elbows and lift your hands straight in front of you at shoulder height. Step back away from the secured end of band/tubing until it becomes tense. Squeezing your shoulder blades together, pull  your hands down to the sides of your thighs. Do not allow your hands to go behind you. Hold for 3 seconds. Slowly ease the tension on the band/tubing as you reverse the directions and return to the starting position. Repeat 2 times. Complete this exercise 3 times per week.   STRENGTH - Scapular Retractors Secure a rubber exercise band/tubing so that it is at the height of your shoulders when you are either standing or sitting on a firm arm-less chair. With a palm-down grip, grasp an end of the band/tubing in each hand. Straighten your elbows and lift your hands straight in front of you at shoulder height. Step back away from the secured end of band/tubing until it becomes tense. Squeezing your shoulder blades together, draw your elbows back as you bend them. Keep your upper arm lifted away from your body throughout the exercise. Hold 3 seconds. Slowly ease the tension on the band/tubing as you reverse the directions and return to the starting position. Repeat 2 times. Complete this exercise 3 times per week.  STRENGTH - Scapular Depressors Find a sturdy chair without wheels, such as a from a dining room table. Keeping your feet on the floor, lift your bottom from the seat and lock your elbows. Keeping your elbows straight, allow gravity to pull your body weight down. Your shoulders will rise toward your ears. Raise your body against gravity by drawing your shoulder blades down your back, shortening the distance between your shoulders and ears. Although your feet should always maintain contact with the floor, your feet should progressively support less body weight as you get stronger. Hold 3 seconds. In a controlled and slow manner, lower your body weight to begin the next repetition. Repeat 2 times. Complete this exercise 3 times per week.    This information is not intended to replace advice given to you by your health care provider. Make sure you discuss any questions you have with your health  care provider.   Document Released: 01/08/2005 Document Revised: 03/17/2014 Document Reviewed: 06/08/2008 Elsevier Interactive Patient Education [employer].

## 2021-04-20 LAB — LIPID PANEL
Cholesterol: 211 mg/dL — ABNORMAL HIGH (ref <200)
HDL: 53 mg/dL (ref >=40)
LDL Cholesterol (Calc): 131 mg/dL (calc) — ABNORMAL HIGH
Non-HDL Cholesterol (Calc): 158 mg/dL (calc) — ABNORMAL HIGH (ref <130)
Total CHOL/HDL Ratio: 4 (calc) (ref <5.0)
Triglycerides: 158 mg/dL — ABNORMAL HIGH (ref <150)

## 2021-04-20 LAB — COMPREHENSIVE METABOLIC PANEL
AG Ratio: 1.8 (calc) (ref 1.0–2.5)
ALT: 28 U/L (ref 9–46)
AST: 25 U/L (ref 10–40)
Albumin: 4.4 g/dL (ref 3.6–5.1)
Alkaline phosphatase (APISO): 40 U/L (ref 36–130)
BUN/Creatinine Ratio: 19 (calc) (ref 6–22)
BUN: 27 mg/dL — ABNORMAL HIGH (ref 7–25)
CO2: 29 mmol/L (ref 20–32)
Calcium: 9.1 mg/dL (ref 8.6–10.3)
Chloride: 101 mmol/L (ref 98–110)
Creat: 1.45 mg/dL — ABNORMAL HIGH (ref 0.60–1.29)
Globulin: 2.5 g/dL (calc) (ref 1.9–3.7)
Glucose, Bld: 84 mg/dL (ref 65–99)
Potassium: 3.7 mmol/L (ref 3.5–5.3)
Sodium: 139 mmol/L (ref 135–146)
Total Bilirubin: 0.5 mg/dL (ref 0.2–1.2)
Total Protein: 6.9 g/dL (ref 6.1–8.1)

## 2021-04-20 LAB — CBC
HCT: 44.9 % (ref 38.5–50.0)
Hemoglobin: 15.3 g/dL (ref 13.2–17.1)
MCH: 30.2 pg (ref 27.0–33.0)
MCHC: 34.1 g/dL (ref 32.0–36.0)
MCV: 88.7 fL (ref 80.0–100.0)
MPV: 9.7 fL (ref 7.5–12.5)
Platelets: 177 10*3/uL (ref 140–400)
RBC: 5.06 10*6/uL (ref 4.20–5.80)
RDW: 12.2 % (ref 11.0–15.0)
WBC: 8.8 10*3/uL (ref 3.8–10.8)

## 2021-04-20 LAB — PSA: PSA: 1.48 ng/mL (ref <=4.00)

## 2021-04-23 ENCOUNTER — Encounter: Payer: Self-pay | Admitting: Family Medicine

## 2021-04-23 ENCOUNTER — Other Ambulatory Visit: Payer: Self-pay

## 2021-04-23 ENCOUNTER — Ambulatory Visit (INDEPENDENT_AMBULATORY_CARE_PROVIDER_SITE_OTHER): Payer: BC Managed Care – PPO | Admitting: Family Medicine

## 2021-04-23 ENCOUNTER — Ambulatory Visit: Payer: Self-pay

## 2021-04-23 ENCOUNTER — Ambulatory Visit (INDEPENDENT_AMBULATORY_CARE_PROVIDER_SITE_OTHER): Payer: BC Managed Care – PPO

## 2021-04-23 VITALS — BP 104/70 | HR 55 | Ht 72.0 in | Wt 177.2 lb

## 2021-04-23 DIAGNOSIS — G8929 Other chronic pain: Secondary | ICD-10-CM | POA: Diagnosis not present

## 2021-04-23 DIAGNOSIS — M25511 Pain in right shoulder: Secondary | ICD-10-CM | POA: Diagnosis not present

## 2021-04-23 DIAGNOSIS — N419 Inflammatory disease of prostate, unspecified: Secondary | ICD-10-CM | POA: Insufficient documentation

## 2021-04-23 NOTE — Patient Instructions (Addendum)
Nice to meet you.  I've referred you to PT.  They will call you to schedule but please let us know if you haven't heard from them in one week regarding scheduling.  Please get an Xray today before you leave.  Follow-up: 6 weeks

## 2021-04-23 NOTE — Progress Notes (Signed)
° °  I, Wendy Poet, LAT, ATC, am serving as scribe for Dr. Lynne Leader.  Subjective:    CC: R shoulder/upper arm pain  HPI: Pt is a 41 y/o male c/o R shoulder/upper arm pain ongoing for approximately 6-9 months w/ no MOI or change in his activity. Pt locates pain to his R upper, lateral arm.  He was previously seen by his PCP on 09/16/19 for R upper trap and neck pain due to performing heavy shrugs at the gym.  Also plays competitive softball.  Radiates: no Mechanical symptoms: no UE Numbness/tingling:no Aggravates: R shoulder horiz aBd; bench and overhead press Treatments tried: rest; ice; heat; oral OTC NSAIDs  Pertinent review of Systems: No fevers or chills  Relevant historical information: History of prostatitis.  Otherwise healthy.   Objective:    Vitals:   04/23/21 1604  BP: 104/70  Pulse: (!) 55  SpO2: 95%   General: Well Developed, well nourished, and in no acute distress.   MSK:  Right shoulder normal-appearing Nontender. Normal shoulder motion some pain with abduction and internal rotation. Strength abduction 4+/5 otherwise intact. Positive Hawkins and Neer's test.  Positive empty can test.  Negative Yergason's and speeds test. Positive O'Brien's test.  Positive crossover arm compression test.  Lab and Radiology Results  Diagnostic Limited MSK Ultrasound of: Right shoulder Biceps tendon normal-appearing Subscapularis tendon normal. Supraspinatus tendon intact. Mild subacromial bursitis present. Infraspinatus tendon normal-appearing AC joint mild effusion. Impression: Subacromial bursitis  X-ray images right shoulder obtained today personally and independently interpreted No acute fractures.  No significant degenerative changes. Await formal radiology review     Impression and Recommendations:    Assessment and Plan: 41 y.o. male with right shoulder pain due to subacromial impingement and bursitis. Catalina Antigua is a great candidate for some physical  therapy.  He works at [employer] so [employer] [facility] location is an obvious choice for him.  Plan for physical therapy and check back in 6 weeks.  If not improving would consider injection or MRI.Marland Kitchen  PDMP not reviewed this encounter. Orders Placed This Encounter  Procedures   [place] LIMITED JOINT SPACE STRUCTURES UP RIGHT(NO LINKED CHARGES)    Order Specific Question:   Reason for Exam (SYMPTOM  OR DIAGNOSIS REQUIRED)    Answer:   R shoulder pain    Order Specific Question:   Preferred imaging location?    Answer:   [place]   DG Shoulder Right    Standing Status:   Future    Number of Occurrences:   1    Standing Expiration Date:   05/21/2021    Order Specific Question:   Reason for Exam (SYMPTOM  OR DIAGNOSIS REQUIRED)    Answer:   R shoulder pain    Order Specific Question:   Preferred imaging location?    Answer:   [facility]   Ambulatory referral to Physical Therapy    Referral Priority:   Routine    Referral Type:   Physical Medicine    Referral Reason:   Specialty Services Required    Requested Specialty:   Physical Therapy    Number of Visits Requested:   1   No orders of the defined types were placed in this encounter.   Discussed warning signs or symptoms. Please see discharge instructions. Patient expresses understanding.   The above documentation has been reviewed and is accurate and complete Lynne Leader, M.D.

## 2021-04-24 NOTE — Progress Notes (Signed)
Right shoulder x-ray looks normal to radiology

## 2021-05-28 ENCOUNTER — Ambulatory Visit: Payer: BC Managed Care – PPO | Attending: Family Medicine | Admitting: Physical Therapy

## 2021-05-28 ENCOUNTER — Other Ambulatory Visit: Payer: Self-pay

## 2021-05-28 ENCOUNTER — Encounter: Payer: Self-pay | Admitting: Physical Therapy

## 2021-05-28 DIAGNOSIS — M25511 Pain in right shoulder: Secondary | ICD-10-CM | POA: Diagnosis not present

## 2021-05-28 DIAGNOSIS — G8929 Other chronic pain: Secondary | ICD-10-CM | POA: Insufficient documentation

## 2021-05-28 DIAGNOSIS — R293 Abnormal posture: Secondary | ICD-10-CM | POA: Diagnosis not present

## 2021-05-28 DIAGNOSIS — M6281 Muscle weakness (generalized): Secondary | ICD-10-CM | POA: Insufficient documentation

## 2021-05-28 DIAGNOSIS — R29898 Other symptoms and signs involving the musculoskeletal system: Secondary | ICD-10-CM | POA: Diagnosis not present

## 2021-05-28 NOTE — Patient Instructions (Signed)
? ? ? ?  Access Code: [email or web address] ?URL: [email or web address] ?Date: 05/28/2021 ?Prepared by: Glenetta Hew ? ?Exercises ?Doorway Pec Stretch at 60 Degrees Abduction with Arm Straight - 2-3 x daily - 7 x weekly - 3 reps - 30 sec hold ?Doorway Pec Stretch at 90 Degrees Abduction - 2-3 x daily - 7 x weekly - 3 reps - 30 sec hold ?Doorway Pec Stretch at 120 Degrees Abduction - 2-3 x daily - 7 x weekly - 3 reps - 30 sec hold ?Sidelying Open Book Thoracic Lumbar Rotation and Extension - 1-2 x daily - 7 x weekly - 10 reps - 5 sec hold ?Standing Shoulder Row with Anchored Resistance - 1 x daily - 7 x weekly - 2 sets - 10 reps - 5 sec hold ?Scapular Retraction with Resistance Advanced - 1 x daily - 7 x weekly - 2 sets - 10 reps - 5 sec hold ? ?Patient Education ?Trigger Point Dry Needling ?

## 2021-05-28 NOTE — Therapy (Signed)
[place] ?[facility] ?[address]  [address] ?[place], [place], [id number] ?Phone: [phone]   Fax:  [phone] ? ?Physical Therapy Evaluation ? ?Patient Details  ?Name: Shane CrutchJeffrey M Hull ?MRN: [id number] ?Date of Birth: 1980/12/24 ?Referring Provider (PT): Rodolph BongEvan S Corey, MD ? ? ?Encounter Date: 05/28/2021 ? ? PT End of Session - 05/28/21 1315   ? ? Visit Number 1   ? Date for PT Re-Evaluation 07/09/21   ? Authorization Type BCBS - VL: 60 (PT/OT/ST combined)   ? PT Start Time 1315   ? PT Stop Time 1401   ? PT Time Calculation (min) 46 min   ? Activity Tolerance Patient tolerated treatment well   ? Behavior [facility] for tasks assessed/performed   ? ?  ?  ? ?  ? ? ?Past Medical History:  ?Diagnosis Date  ? No known health problems   ? ? ?Past Surgical History:  ?Procedure Laterality Date  ? VASECTOMY    ? WISDOM TOOTH EXTRACTION    ? ? ?There were no vitals filed for this visit. ? ? ? Subjective Assessment - 05/28/21 1322   ? ? Subjective Pt reports gradual onset/worsening of R shoulder pain over tha past 6 months - more of an annoyance with owrking out or playing softball. Imaging revealed probable buristis but no tears identified.   ? Diagnostic tests 04/23/21 - Diagnostic Limited MSK Ultrasound of Right shoulder  Biceps tendon normal-appearing  Subscapularis tendon normal.  Supraspinatus tendon intact.  Mild subacromial bursitis present.  Infraspinatus tendon normal-appearing  AC joint mild effusion.  Impression: Subacromial bursitis;   R shoulder x-ray: No acute osseous abnormality.   ? Patient Stated Goals "no pain with sports, esp softball"   ? Currently in Pain? No/denies   ? Pain Score 0-No pain   6-7/10 with certain activities/motions  ? Pain Location Arm   ? Pain Orientation Right;Upper   ? Pain Descriptors / Indicators Throbbing   ? Pain Type Chronic pain   ? Pain Radiating Towards n/a   ? Pain Onset More than a month ago   ~6 months  ? Pain Frequency  Intermittent   ? Aggravating Factors  reaching to put on a jacket, overhead motions   ? Pain Relieving Factors stop triggering activity, ice, heat, topical analgesics   ? Effect of Pain on Daily Activities difficulty putting on a jacket, limits participation in sports but fights through pain with softball   ? ?  ?  ? ?  ? ? ? ? ? [facility] PT Assessment - 05/28/21 1315   ? ?  ? Assessment  ? Medical Diagnosis Chronic R shoulder pain   ? Referring Provider (PT) Rodolph BongEvan S Corey, MD   ? Onset Date/Surgical Date --   ~6 months  ? Hand Dominance Right   ? Next MD Visit 06/05/21 - plans to push it back ~1 month   ? Prior Therapy none for current problem, h/o PT for pelvic floor   ?  ? Precautions  ? Precautions None   ?  ? Restrictions  ? Weight Bearing Restrictions No   ?  ? Balance Screen  ? Has the patient fallen in the past 6 months No   ? Has the patient had a decrease in activity level because of a fear of falling?  No   ? Is the patient reluctant to leave their home because of a fear of falling?  No   ?  ?  Home Environment  ? Living Environment Private residence   ?  ? Prior Function  ? Level of Independence Independent   ? Vocation Full time employment   ? Vocation Requirements IT - mostly deskwork   ? Leisure softball, hiking, gym rat - 6 days/wk   ?  ? Cognition  ? Overall Cognitive Status Within Functional Limits for tasks assessed   ?  ? Observation/Other Assessments  ? Focus on Therapeutic Outcomes (FOTO)  Shoulder: FS = 62, predicted D/C = 74   ?  ? Posture/Postural Control  ? Posture/Postural Control Postural limitations   ? Postural Limitations Forward head;Rounded Shoulders   ? Posture Comments mildly protracted R scapula   ?  ? ROM / Strength  ? AROM / PROM / Strength AROM;Strength   ?  ? AROM  ? AROM Assessment Site Shoulder   ? Right/Left Shoulder Right;Left   ? Right Shoulder Extension 46 Degrees   ? Right Shoulder Flexion 126 Degrees   ? Right Shoulder ABduction 124 Degrees   ? Right Shoulder Internal  Rotation 52 Degrees   ? Right Shoulder External Rotation 80 Degrees   ? Left Shoulder Extension 50 Degrees   ? Left Shoulder Flexion 151 Degrees   ? Left Shoulder ABduction 135 Degrees   ? Left Shoulder Internal Rotation 65 Degrees   ? Left Shoulder External Rotation 80 Degrees   ?  ? Strength  ? Strength Assessment Site Shoulder   ? Right/Left Shoulder Right;Left   ? Right Shoulder Flexion 4+/5   ? Right Shoulder ABduction 4-/5   ? Right Shoulder Internal Rotation 4/5   ? Right Shoulder External Rotation 4/5   ? Left Shoulder Flexion 5/5   ? Left Shoulder ABduction 5/5   ? Left Shoulder Internal Rotation 4+/5   ? Left Shoulder External Rotation 4+/5   ?  ? Palpation  ? Palpation comment ttp in lateral deltoid   ?  ? Special Tests  ?  Special Tests Rotator Cuff Impingement   ? Rotator Cuff Impingment tests Leanord Asal test   ?  ? Hawkins-Kennedy test  ? Findings Negative   ? Side Right   ? ?  ?  ? ?  ? ? ? ? ? ? ? ? ? ? ? ? ? ?Objective measurements completed on examination: See above findings.  ? ? ? ? ? [facility] Adult PT Treatment/Exercise - 05/28/21 1315   ? ?  ? Exercises  ? Exercises Shoulder   ?  ? Shoulder Exercises: Standing  ? Row Both;10 reps;Strengthening;Theraband   ? Theraband Level (Shoulder Row) Level 3 (Green)   ? Row Limitations cues for scap retraction/engagement   ? Retraction Both;10 reps;Strengthening;Theraband   ? Theraband Level (Shoulder Retraction) Level 3 (Green)   ? Retraction Limitations cues for scap retraction + shoulder extension to neutral   ?  ? Shoulder Exercises: Stretch  ? Other Shoulder Stretches 3-way doorway pec stretch x 30 sec each   ? Other Shoulder Stretches R bow & arrow open book stretch 5 x 5"   ? ?  ?  ? ?  ? ? ? ? ? ? ? ? ? ? PT Education - 05/28/21 1400   ? ? Education Details Access Code: [email or web address]   ? ?  ?  ? ?  ? ? ? PT Short Term Goals - 05/28/21 1401   ? ?  ? PT SHORT TERM GOAL #1  ? Title Patient will be independent with initial HEP   ?  Status New   ? Target  Date 06/18/21   ? ?  ?  ? ?  ? ? ? ? PT Long Term Goals - 05/28/21 1401   ? ?  ? PT LONG TERM GOAL #1  ? Title Patient will be independent with ongoing/advanced HEP +/- gym program for self-management at home   ? Status New   ? Target Date 07/09/21   ?  ? PT LONG TERM GOAL #2  ? Title Improve posture and alignment with patient to demonstrate improved upright posture with posterior shoulder girdle engaged   ? Status New   ? Target Date 07/09/21   ?  ? PT LONG TERM GOAL #3  ? Title Patient to improve R shoulder AROM to WNL without pain provocation   ? Status New   ? Target Date 07/09/21   ?  ? PT LONG TERM GOAL #4  ? Title Patient will demonstrate improved R shoulder strength to 5/5 for functional UE use   ? Status New   ? Target Date 07/09/21   ?  ? PT LONG TERM GOAL #5  ? Title Patient to report ability to perform ADLs and participate in sports and leisure activities without limitation due to R shoulder pain, LOM or weakness   ? Status New   ? Target Date 07/09/21   ? ?  ?  ? ?  ? ? ? ? ? ? ? ? ? Plan - 05/28/21 1401   ? ? Clinical Impression Statement Leotis Shames (?[name]?) is a 41 y/o male who presents to OP PT for chronic R shoulder pain with gradual onset/worsening over the past ~6 months w/o known MOI. Pain is only intermittent with certain motions and typically resolves upon moving away from triggering activity - creates difficulty with overhead activities and movements such as putting on a jacket. Current deficits include intermittent R shoulder pain, postural abnormalities, limited and painful/tight R shoulder ROM in all planes except ER and extension, increased muscle tension in lateral pecs and upper shoulder/deltoids, decreased R shoulder strength, and limited functional use of R UE. Pain and limited motion also prevent him from participating in sports as he normally would but does not stop him from playing competitive softball (plays through the pain). [name] will benefit from skilled PT to address above  deficits, improve posture and restore pain-free functional ROM and strength in R shoulder to allow him to resume normal daily activities and participation in softball and other sports without pain interference.   ? Perso

## 2021-06-03 ENCOUNTER — Ambulatory Visit: Payer: BC Managed Care – PPO | Admitting: Physical Therapy

## 2021-06-03 ENCOUNTER — Other Ambulatory Visit: Payer: Self-pay

## 2021-06-03 ENCOUNTER — Encounter: Payer: Self-pay | Admitting: Physical Therapy

## 2021-06-03 DIAGNOSIS — M25511 Pain in right shoulder: Secondary | ICD-10-CM | POA: Diagnosis not present

## 2021-06-03 DIAGNOSIS — R29898 Other symptoms and signs involving the musculoskeletal system: Secondary | ICD-10-CM | POA: Diagnosis not present

## 2021-06-03 DIAGNOSIS — G8929 Other chronic pain: Secondary | ICD-10-CM

## 2021-06-03 DIAGNOSIS — R293 Abnormal posture: Secondary | ICD-10-CM | POA: Diagnosis not present

## 2021-06-03 DIAGNOSIS — M6281 Muscle weakness (generalized): Secondary | ICD-10-CM | POA: Diagnosis not present

## 2021-06-03 NOTE — Therapy (Signed)
[place] ?[facility] ?[address]  [address] ?[place], [place], [address] ?Phone: [phone]   Fax:  [phone] ? ?Physical Therapy Treatment ? ?Patient Details  ?Name: Tyrone Preston ?MRN: [id number] ?Date of Birth: [birth year] ?Referring Provider (PT): Rodolph Bong, MD ? ? ?Encounter Date: 06/03/2021 ? ? PT End of Session - 06/03/21 1621   ? ? Visit Number 2   ? Date for PT Re-Evaluation 07/09/21   ? Authorization Type BCBS - VL: 60 (PT/OT/ST combined)   ? PT Start Time 1621   ? PT Stop Time 1701   ? PT Time Calculation (min) 40 min   ? Activity Tolerance Patient tolerated treatment well   ? Behavior During Therapy [facility] for tasks assessed/performed   ? ?  ?  ? ?  ? ? ?Past Medical History:  ?Diagnosis Date  ? No known health problems   ? ? ?Past Surgical History:  ?Procedure Laterality Date  ? VASECTOMY    ? WISDOM TOOTH EXTRACTION    ? ? ?There were no vitals filed for this visit. ? ? Subjective Assessment - 06/03/21 1624   ? ? Subjective Pt reports high position for doorway stretch was a little uncomfortable but otherwise no issues with HEP. Had a softball tournement over the weekend and his shoulder was sore after that but better now.   ? Diagnostic tests 04/23/21 - Diagnostic Limited MSK Ultrasound of Right shoulder  Biceps tendon normal-appearing  Subscapularis tendon normal.  Supraspinatus tendon intact.  Mild subacromial bursitis present.  Infraspinatus tendon normal-appearing  AC joint mild effusion.  Impression: Subacromial bursitis;   R shoulder x-ray: No acute osseous abnormality.   ? Patient Stated Goals "no pain with sports, esp softball"   ? Currently in Pain? No/denies   ? Pain Onset More than a month ago   ~6 months  ? ?  ?  ? ?  ? ? ? ? ? ? ? ? ? ? ? ? ? ? ? ? ? ? ? ? [facility] Adult PT Treatment/Exercise - 06/03/21 1621   ? ?  ? Exercises  ? Exercises Shoulder   ?  ? Shoulder Exercises: Standing  ? External Rotation Both;10 reps;Strengthening;Theraband   ?  Theraband Level (Shoulder External Rotation) Level 3 (Green)   ? External Rotation Limitations + scap retraction with back along doorframe as tacile cue for scap engagement   ? Row Both;10 reps;Strengthening;Theraband   ? Theraband Level (Shoulder Row) Level 3 (Green)   ? Row Limitations cues for scap retraction/engagement, avoiding excessive extension or upward rotation of elbows   ? Retraction Both;10 reps;Strengthening;Theraband   ? Theraband Level (Shoulder Retraction) Level 3 (Green)   ? Retraction Limitations cues for scap retraction + shoulder extension to neutral   ?  ? Shoulder Exercises: ROM/Strengthening  ? UBE (Upper Arm Bike) L2.0 x 6 min (3 min each fwd & back)   ?  ? Shoulder Exercises: Stretch  ? Other Shoulder Stretches 3-way doorway pec stretch x 30 sec each   ?  ? Manual Therapy  ? Manual Therapy Soft tissue mobilization   ? Manual therapy comments skilled palpation and monitoring of soft tissue during DN   ? Soft tissue mobilization STM to R lats, teres group, UT, anterolateral deltoid and pecs   ? ?  ?  ? ?  ? ? ? Trigger Point Dry Needling - 06/03/21 1621   ? ? Consent Given? Yes   ? Education Handout  Provided Yes   ? Muscles Treated Head and Neck Upper trapezius   ? Muscles Treated Upper Quadrant Pectoralis major;Pectoralis minor;Latissimus dorsi;Teres major;Teres minor   ? Dry Needling Comments Right   ? Upper Trapezius Response Twitch reponse elicited;Palpable increased muscle length   ? Pectoralis Major Response Twitch response elicited;Palpable increased muscle length   ? Pectoralis Minor Response Twitch response elicited;Palpable increased muscle length   ? Latissimus dorsi Response Twitch response elicited;Palpable increased muscle length   ? Teres major Response Twitch response elicited;Palpable increased muscle length   ? Teres minor Response Twitch response elicited;Palpable increased muscle length   ? ?  ?  ? ?  ? ? ? ? ? ? ? ? ? ? PT Short Term Goals - 06/03/21 1626   ? ?  ? PT  SHORT TERM GOAL #1  ? Title Patient will be independent with initial HEP   ? Status On-going   ? Target Date 06/18/21   ? ?  ?  ? ?  ? ? ? ? PT Long Term Goals - 06/03/21 1626   ? ?  ? PT LONG TERM GOAL #1  ? Title Patient will be independent with ongoing/advanced HEP +/- gym program for self-management at home   ? Status On-going   ? Target Date 07/09/21   ?  ? PT LONG TERM GOAL #2  ? Title Improve posture and alignment with patient to demonstrate improved upright posture with posterior shoulder girdle engaged   ? Status On-going   ? Target Date 07/09/21   ?  ? PT LONG TERM GOAL #3  ? Title Patient to improve R shoulder AROM to WNL without pain provocation   ? Status On-going   ? Target Date 07/09/21   ?  ? PT LONG TERM GOAL #4  ? Title Patient will demonstrate improved R shoulder strength to 5/5 for functional UE use   ? Status On-going   ? Target Date 07/09/21   ?  ? PT LONG TERM GOAL #5  ? Title Patient to report ability to perform ADLs and participate in sports and leisure activities without limitation due to R shoulder pain, LOM or weakness   ? Status On-going   ? Target Date 07/09/21   ? ?  ?  ? ?  ? ? ? ? ? ? ? ? Plan - 06/03/21 1626   ? ? Clinical Impression Statement Tyrone Preston reports no issues with HEP other than some discomfort with upper/overhead position with 3-way doorway pec stretch. HEP reviewed with only minor clarification necessary to avoid excessive extension and upward rotation of elbows during theraband rows. Pt denies pain currently but did note some increased soreness following his softball tournament over the weekend. Some increased muscle tension/taut bands and TTP identified t/o the R shoulder complex which appeared amenable to DN. After explanation of DN rational, procedures, outcomes and potential side effects, patient verbalized consent to DN treatment in conjunction with manual STM to reduce ttp/muscle tension. Muscles treated include R lats, teres group, UT, anterolateral deltoid and pec  major/minor. DN produced normal response with good twitches elicited resulting in palpable reduction in pain/ttp and muscle tension and improved flexibility with stretches. Pt educated to expect mild to moderate muscle soreness for up to 24-48 hrs and instructed to continue prescribed home exercise program and current activity level with pt verbalizing understanding of theses instructions.   ? Rehab Potential Good   ? PT Frequency 2x / week   1-2x/wk  ? PT  Duration 6 weeks   ? PT Treatment/Interventions ADLs/Self Care Home Management;Cryotherapy;Electrical Stimulation;Iontophoresis 4mg /ml Dexamethasone;Moist Heat;Ultrasound;Therapeutic activities;Therapeutic exercise;Neuromuscular re-education;Patient/family education;Manual techniques;Passive range of motion;Dry needling;Taping;Vasopneumatic Device;Joint Manipulations   ? PT Next Visit Plan Assess response to DN; postural strengthening; R shoulder ROM; gentle R shoulder/RTC strengthening; update HEP as indicated due to 1x/wk frequency; MT +/- DN to address increased muscle tension/tightness; modalities PRN   ? PT Home Exercise Plan Access Code: [email or web address]   ? Consulted and Agree with Plan of Care Patient   ? ?  ?  ? ?  ? ? ?Patient will benefit from skilled therapeutic intervention in order to improve the following deficits and impairments:  Decreased activity tolerance, Decreased range of motion, Decreased strength, Increased fascial restricitons, Increased muscle spasms, Impaired perceived functional ability, Impaired flexibility, Impaired UE functional use, Improper body mechanics, Postural dysfunction, Pain ? ?Visit Diagnosis: ?Chronic right shoulder pain ? ?Abnormal posture ? ?Muscle weakness (generalized) ? ?Other symptoms and signs involving the musculoskeletal system ? ? ? ? ?Problem List ?Patient Active Problem List  ? Diagnosis Date Noted  ? Prostatitis 04/23/2021  ? Anal fissure 09/01/2016  ? ? ?Percival Spanish, PT ?06/03/2021, 5:25 PM ? ?[facility] ?[address] ?[place], [place], [address] ?Phone: [phone]   Fax:  [phone] ? ?Name: Tyrone Preston ?MRN: [id number] ?Date of Birth: [birth year]

## 2021-06-05 ENCOUNTER — Ambulatory Visit: Payer: BC Managed Care – PPO | Admitting: Family Medicine

## 2021-06-10 ENCOUNTER — Ambulatory Visit: Payer: BC Managed Care – PPO | Attending: Family Medicine

## 2021-06-10 DIAGNOSIS — G8929 Other chronic pain: Secondary | ICD-10-CM | POA: Insufficient documentation

## 2021-06-10 DIAGNOSIS — R29898 Other symptoms and signs involving the musculoskeletal system: Secondary | ICD-10-CM | POA: Insufficient documentation

## 2021-06-10 DIAGNOSIS — M25511 Pain in right shoulder: Secondary | ICD-10-CM | POA: Diagnosis not present

## 2021-06-10 DIAGNOSIS — M6281 Muscle weakness (generalized): Secondary | ICD-10-CM | POA: Diagnosis not present

## 2021-06-10 DIAGNOSIS — R293 Abnormal posture: Secondary | ICD-10-CM | POA: Insufficient documentation

## 2021-06-10 NOTE — Therapy (Signed)
[place] ?[facility] ?[address]  [address] ?[place], [place], [address] ?Phone: [phone]   Fax:  [phone] ? ?Physical Therapy Treatment ? ?Patient Details  ?Name: Tyrone Preston ?MRN: [id number] ?Date of Birth: [birth year] ?Referring Provider (PT): Rodolph Bong, MD ? ? ?Encounter Date: 06/10/2021 ? ? PT End of Session - 06/10/21 1622   ? ? Visit Number 3   ? Date for PT Re-Evaluation 07/09/21   ? Authorization Type BCBS - VL: 60 (PT/OT/ST combined)   ? PT Start Time 1532   ? PT Stop Time 1614   ? PT Time Calculation (min) 42 min   ? Activity Tolerance Patient tolerated treatment well   ? Behavior During Therapy [facility] for tasks assessed/performed   ? ?  ?  ? ?  ? ? ?Past Medical History:  ?Diagnosis Date  ? No known health problems   ? ? ?Past Surgical History:  ?Procedure Laterality Date  ? VASECTOMY    ? WISDOM TOOTH EXTRACTION    ? ? ?There were no vitals filed for this visit. ? ? Subjective Assessment - 06/10/21 1534   ? ? Subjective The DN helped, I was able to participate in softball not much soreness the next day.   ? Diagnostic tests 04/23/21 - Diagnostic Limited MSK Ultrasound of Right shoulder  Biceps tendon normal-appearing  Subscapularis tendon normal.  Supraspinatus tendon intact.  Mild subacromial bursitis present.  Infraspinatus tendon normal-appearing  AC joint mild effusion.  Impression: Subacromial bursitis;   R shoulder x-ray: No acute osseous abnormality.   ? Patient Stated Goals "no pain with sports, esp softball"   ? Currently in Pain? Yes   ? Pain Score 2    ? Pain Location Arm   ? Pain Orientation Right;Upper   ? Pain Descriptors / Indicators Throbbing   ? Pain Type Chronic pain   ? ?  ?  ? ?  ? ? ? ? ? ? ? ? ? ? ? ? ? ? ? ? ? ? ? ? [facility] Adult PT Treatment/Exercise - 06/10/21 0001   ? ?  ? Shoulder Exercises: Standing  ? Horizontal ABduction Strengthening;Both;10 reps;Theraband   2 sets  ? Theraband Level (Shoulder Horizontal ABduction) Level 2  (Red)   ? External Rotation Strengthening;Right;10 reps;Theraband   ? Theraband Level (Shoulder External Rotation) Level 2 (Red)   ? External Rotation Limitations towel tucked into elbow   ? Internal Rotation Strengthening;Right;10 reps;Theraband   ? Theraband Level (Shoulder Internal Rotation) Level 2 (Red)   ? Internal Rotation Limitations towel tucked btw elbow   ? Other Standing Exercises scaption with 5lb weights 2 x 10   ?  ? Shoulder Exercises: ROM/Strengthening  ? UBE (Upper Arm Bike) L2.5 x 6 min (3 min each fwd & back)   ? Other ROM/Strengthening Exercises serratus slide with foam roll x 12   ?  ? Manual Therapy  ? Manual Therapy Soft tissue mobilization   ? Soft tissue mobilization STM to R lats, anterolateral deltoid and pecs   ? ?  ?  ? ?  ? ? ? ? ? ? ? ? ? ? PT Education - 06/10/21 1804   ? ? Education Details HEP update; horizontal ABD and ER   ? Person(s) Educated Patient   ? Methods Explanation;Demonstration;Handout   ? Comprehension Verbalized understanding;Returned demonstration   ? ?  ?  ? ?  ? ? ? PT Short Term Goals - 06/03/21 1626   ? ?  ?  PT SHORT TERM GOAL #1  ? Title Patient will be independent with initial HEP   ? Status On-going   ? Target Date 06/18/21   ? ?  ?  ? ?  ? ? ? ? PT Long Term Goals - 06/03/21 1626   ? ?  ? PT LONG TERM GOAL #1  ? Title Patient will be independent with ongoing/advanced HEP +/- gym program for self-management at home   ? Status On-going   ? Target Date 07/09/21   ?  ? PT LONG TERM GOAL #2  ? Title Improve posture and alignment with patient to demonstrate improved upright posture with posterior shoulder girdle engaged   ? Status On-going   ? Target Date 07/09/21   ?  ? PT LONG TERM GOAL #3  ? Title Patient to improve R shoulder AROM to WNL without pain provocation   ? Status On-going   ? Target Date 07/09/21   ?  ? PT LONG TERM GOAL #4  ? Title Patient will demonstrate improved R shoulder strength to 5/5 for functional UE use   ? Status On-going   ? Target Date  07/09/21   ?  ? PT LONG TERM GOAL #5  ? Title Patient to report ability to perform ADLs and participate in sports and leisure activities without limitation due to R shoulder pain, LOM or weakness   ? Status On-going   ? Target Date 07/09/21   ? ?  ?  ? ?  ? ? ? ? ? ? ? ? Plan - 06/10/21 1622   ? ? Clinical Impression Statement Pt noted benefit from the DN last visit and was able to participate in softball w/o being sore afterwards. Today progressed TE to incorporate more periscap muscle recruitment and RTC strengthening. Afterwards pt noted some soreness, followed with STM to the R shoulder, very tender in the pec/anterior deltoid/bicep area, may benefit from more DN.   ? Personal Factors and Comorbidities Time since onset of injury/illness/exacerbation;Profession   ? PT Frequency 2x / week   1-2x per week  ? PT Duration 6 weeks   ? PT Treatment/Interventions ADLs/Self Care Home Management;Cryotherapy;Electrical Stimulation;Iontophoresis 4mg /ml Dexamethasone;Moist Heat;Ultrasound;Therapeutic activities;Therapeutic exercise;Neuromuscular re-education;Patient/family education;Manual techniques;Passive range of motion;Dry needling;Taping;Vasopneumatic Device;Joint Manipulations   ? PT Next Visit Plan postural strengthening; R shoulder ROM; gentle R shoulder/RTC strengthening; update HEP as indicated due to 1x/wk frequency; MT +/- DN to address increased muscle tension/tightness; modalities PRN   ? PT Home Exercise Plan Access Code:   ? Consulted and Agree with Plan of Care Patient   ? ?  ?  ? ?  ? ? ?Patient will benefit from skilled therapeutic intervention in order to improve the following deficits and impairments:  Decreased activity tolerance, Decreased range of motion, Decreased strength, Increased fascial restricitons, Increased muscle spasms, Impaired perceived functional ability, Impaired flexibility, Impaired UE functional use, Improper body mechanics, Postural dysfunction, Pain ? ?Visit  Diagnosis: ?Chronic right shoulder pain ? ?Abnormal posture ? ?Muscle weakness (generalized) ? ?Other symptoms and signs involving the musculoskeletal system ? ? ? ? ?Problem List ?Patient Active Problem List  ? Diagnosis Date Noted  ? Prostatitis 04/23/2021  ? Anal fissure 09/01/2016  ? ? ?09/03/2016, PTA ?06/10/2021, 6:05 PM ? ?[place] ?[facility] ?[address] ?[place], [place], [place] ?Phone: [phone]   Fax:  [phone] ? ?Name: Tyrone Preston ?MRN: Shane Crutch ?Date of Birth: October 20, 1980 ? ? ? ?

## 2021-06-10 NOTE — Patient Instructions (Signed)
Access Code: [email or web address] ?URL: [email or web address] ?Date: 06/10/2021 ?Prepared by: Verta Ellen ? ?Exercises ?- Doorway Pec Stretch at 60 Degrees Abduction with Arm Straight  - 2-3 x daily - 7 x weekly - 3 reps - 30 sec hold ?- Doorway Pec Stretch at 90 Degrees Abduction  - 2-3 x daily - 7 x weekly - 3 reps - 30 sec hold ?- Doorway Pec Stretch at 120 Degrees Abduction  - 2-3 x daily - 7 x weekly - 3 reps - 30 sec hold ?- Sidelying Open Book Thoracic Lumbar Rotation and Extension  - 1-2 x daily - 7 x weekly - 10 reps - 5 sec hold ?- Standing Shoulder Row with Anchored Resistance  - 1 x daily - 7 x weekly - 2 sets - 10 reps - 5 sec hold ?- Scapular Retraction with Resistance Advanced  - 1 x daily - 7 x weekly - 2 sets - 10 reps - 5 sec hold ?- Standing Shoulder Horizontal Abduction with Anchored Resistance  - 1 x daily - 3-4 x weekly - 3 sets - 10 reps ?- Shoulder External Rotation with Anchored Resistance  - 1 x daily - 3-4 x weekly - 3 sets - 10 reps ? ?Patient Education ?- Trigger Point Dry Needling ?

## 2021-06-17 ENCOUNTER — Encounter: Payer: Self-pay | Admitting: Physical Therapy

## 2021-06-17 ENCOUNTER — Ambulatory Visit: Payer: BC Managed Care – PPO | Admitting: Physical Therapy

## 2021-06-17 DIAGNOSIS — R293 Abnormal posture: Secondary | ICD-10-CM

## 2021-06-17 DIAGNOSIS — R29898 Other symptoms and signs involving the musculoskeletal system: Secondary | ICD-10-CM | POA: Diagnosis not present

## 2021-06-17 DIAGNOSIS — G8929 Other chronic pain: Secondary | ICD-10-CM

## 2021-06-17 DIAGNOSIS — M6281 Muscle weakness (generalized): Secondary | ICD-10-CM | POA: Diagnosis not present

## 2021-06-17 DIAGNOSIS — M25511 Pain in right shoulder: Secondary | ICD-10-CM | POA: Diagnosis not present

## 2021-06-17 NOTE — Therapy (Signed)
[place] ?[facility] ?[address]  [address] ?[place], [place], [address] ?Phone: [phone]   Fax:  [phone] ? ?Physical Therapy Treatment ? ?Patient Details  ?Name: Tyrone Preston ?MRN: [id number] ?Date of Birth: 1980-11-22 ?Referring Provider (PT): Rodolph Bong, MD ? ? ?Encounter Date: 06/17/2021 ? ? PT End of Session - 06/17/21 1532   ? ? Visit Number 4   ? Date for PT Re-Evaluation 07/09/21   ? Authorization Type BCBS - VL: 60 (PT/OT/ST combined)   ? PT Start Time 1532   ? PT Stop Time 1618   ? PT Time Calculation (min) 46 min   ? Activity Tolerance Patient tolerated treatment well   ? Behavior During Therapy [facility] for tasks assessed/performed   ? ?  ?  ? ?  ? ? ?Past Medical History:  ?Diagnosis Date  ? No known health problems   ? ? ?Past Surgical History:  ?Procedure Laterality Date  ? VASECTOMY    ? WISDOM TOOTH EXTRACTION    ? ? ?There were no vitals filed for this visit. ? ? Subjective Assessment - 06/17/21 1535   ? ? Subjective Pt denies any pain or issues with his shoulder today. States the DN made a world of difference.   ? Diagnostic tests 04/23/21 - Diagnostic Limited MSK Ultrasound of Right shoulder  Biceps tendon normal-appearing  Subscapularis tendon normal.  Supraspinatus tendon intact.  Mild subacromial bursitis present.  Infraspinatus tendon normal-appearing  AC joint mild effusion.  Impression: Subacromial bursitis;   R shoulder x-ray: No acute osseous abnormality.   ? Patient Stated Goals "no pain with sports, esp softball"   ? Currently in Pain? No/denies   ? ?  ?  ? ?  ? ? ? ? ? ? ? ? ? ? ? ? ? ? ? ? ? ? ? ? [facility] Adult PT Treatment/Exercise - 06/17/21 1532   ? ?  ? Shoulder Exercises: Prone  ? Extension Both;10 reps;Strengthening   ? Extension Limitations I's over green Pball   ? External Rotation Both;10 reps;Strengthening   ? External Rotation Limitations W's over green Pball   ? Horizontal ABduction 1 Both;10 reps;Strengthening   ?  Horizontal ABduction 1 Limitations T's over green Pball   ? Horizontal ABduction 2 Both;10 reps;Strengthening   ? Horizontal ABduction 2 Limitations Y's over green Pball   ?  ? Shoulder Exercises: Standing  ? External Rotation Both;10 reps;Strengthening;Theraband   ? Theraband Level (Shoulder External Rotation) Level 2 (Red)   ? External Rotation Limitations 90/90 shoulder   ? Internal Rotation Both;10 reps;Strengthening;Theraband   ? Theraband Level (Shoulder Internal Rotation) Level 2 (Red)   ? Internal Rotation Limitations 90/90 shoulder   ? Row Both;10 reps;Strengthening;Theraband   ? Theraband Level (Shoulder Row) Level 4 (Blue)   ? Row Limitations cues for scap retraction/engagement, tucking elbows into ribs   ? Retraction Both;10 reps;Strengthening;Theraband   ? Theraband Level (Shoulder Retraction) Level 3 (Green)   ? Retraction Limitations cues for scap retraction + shoulder extension to neutral   ? Other Standing Exercises Blue TB lat pulldown 10 x 3", 2 sets   ?  ? Shoulder Exercises: ROM/Strengthening  ? Nustep L4 x 6 min (UE only)   ?  ? Manual Therapy  ? Manual Therapy Soft tissue mobilization   ? Manual therapy comments skilled palpation and monitoring of soft tissue during DN   ? Soft tissue mobilization STM to R lats, teres group, infraspinatus,  UT, anterolateral deltoid and pecs   ? ?  ?  ? ?  ? ? ? [place] Dry Needling - 06/17/21 1532   ? ? Consent Given? Yes   ? Education Handout Provided Previously provided   ? Muscles Treated Head and Neck Upper trapezius   ? Muscles Treated Upper Quadrant Deltoid;Pectoralis major;Pectoralis minor;Teres major;Teres minor;Latissimus dorsi;Infraspinatus   ? Dry Needling Comments Right   ? Upper Trapezius Response Twitch reponse elicited;Palpable increased muscle length   ? Pectoralis Major Response Twitch response elicited;Palpable increased muscle length   ? Pectoralis Minor Response Twitch response elicited;Palpable increased muscle length   ?  Infraspinatus Response Twitch response elicited;Palpable increased muscle length   ? Deltoid Response Twitch response elicited;Palpable increased muscle length   ? Latissimus dorsi Response Twitch response elicited;Palpable increased muscle length   ? Teres major Response Twitch response elicited;Palpable increased muscle length   ? Teres minor Response Twitch response elicited;Palpable increased muscle length   ? ?  ?  ? ?  ? ? ? ? ? ? ? ? ? ? PT Short Term Goals - 06/17/21 1537   ? ?  ? PT SHORT TERM GOAL #1  ? Title Patient will be independent with initial HEP   ? Status Achieved   06/17/21  ? ?  ?  ? ?  ? ? ? ? PT Long Term Goals - 06/03/21 1626   ? ?  ? PT LONG TERM GOAL #1  ? Title Patient will be independent with ongoing/advanced HEP +/- gym program for self-management at home   ? Status On-going   ? Target Date 07/09/21   ?  ? PT LONG TERM GOAL #2  ? Title Improve posture and alignment with patient to demonstrate improved upright posture with posterior shoulder girdle engaged   ? Status On-going   ? Target Date 07/09/21   ?  ? PT LONG TERM GOAL #3  ? Title Patient to improve R shoulder AROM to WNL without pain provocation   ? Status On-going   ? Target Date 07/09/21   ?  ? PT LONG TERM GOAL #4  ? Title Patient will demonstrate improved R shoulder strength to 5/5 for functional UE use   ? Status On-going   ? Target Date 07/09/21   ?  ? PT LONG TERM GOAL #5  ? Title Patient to report ability to perform ADLs and participate in sports and leisure activities without limitation due to R shoulder pain, LOM or weakness   ? Status On-going   ? Target Date 07/09/21   ? ?  ?  ? ?  ? ? ? ? ? ? ? ? Plan - 06/17/21 1538   ? ? Clinical Impression Statement Tyrone Preston denies pain today and reports his R shoulder has been doing much better since initial DN and PT exercises. R shoulder AROM now nearly symmetrical to L but still limited by some tightness and abnormal muscle tension with continued taut bands and TPs identified in  several muscles t/o R shoulder complex. These were addressed with MT incorporating DN with good twitch responses elicited resulting in palpable reduction in muscle tension and decreased feeling of restriction with ROM. Reviewed exercises, progressing resisted rows and retraction to blue TB and introducing RTC strengthening at 90/90 shoulder position to better simulate throwing motions associated with softball with good tolerance.   ? Rehab Potential Good   ? PT Frequency 2x / week   1-2x per week  ? PT Duration  6 weeks   ? PT Treatment/Interventions ADLs/Self Care Home Management;Cryotherapy;Electrical Stimulation;Iontophoresis 4mg /ml Dexamethasone;Moist Heat;Ultrasound;Therapeutic activities;Therapeutic exercise;Neuromuscular re-education;Patient/family education;Manual techniques;Passive range of motion;Dry needling;Taping;Vasopneumatic Device;Joint Manipulations   ? PT Next Visit Plan postural strengthening; R shoulder ROM; gentle R shoulder/RTC strengthening; update HEP as indicated due to 1x/wk frequency; MT +/- DN to address increased muscle tension/tightness; modalities PRN   ? PT Home Exercise Plan Access Code: [email or web address]   ? Consulted and Agree with Plan of Care Patient   ? ?  ?  ? ?  ? ? ?Patient will benefit from skilled therapeutic intervention in order to improve the following deficits and impairments:  Decreased activity tolerance, Decreased range of motion, Decreased strength, Increased fascial restricitons, Increased muscle spasms, Impaired perceived functional ability, Impaired flexibility, Impaired UE functional use, Improper body mechanics, Postural dysfunction, Pain ? ?Visit Diagnosis: ?Chronic right shoulder pain ? ?Abnormal posture ? ?Muscle weakness (generalized) ? ?Other symptoms and signs involving the musculoskeletal system ? ? ? ? ?Problem List ?Patient Active Problem List  ? Diagnosis Date Noted  ? Prostatitis 04/23/2021  ? Anal fissure 09/01/2016  ? ? ?Marry GuanJoAnne M Aleila Syverson, PT ?06/17/2021, 6:41  PM ? ?[place] ?[facility] ?[address]  [address] ?[place], [place], [address] ?Phone: [phone]   Fax:  [phone] ? ?Name: Tyrone Preston ?MRN: [id number] ?Date of Birth:

## 2021-06-24 ENCOUNTER — Encounter: Payer: Self-pay | Admitting: Physical Therapy

## 2021-06-24 ENCOUNTER — Ambulatory Visit: Payer: BC Managed Care – PPO | Admitting: Physical Therapy

## 2021-06-24 DIAGNOSIS — R293 Abnormal posture: Secondary | ICD-10-CM

## 2021-06-24 DIAGNOSIS — M25511 Pain in right shoulder: Secondary | ICD-10-CM | POA: Diagnosis not present

## 2021-06-24 DIAGNOSIS — M6281 Muscle weakness (generalized): Secondary | ICD-10-CM

## 2021-06-24 DIAGNOSIS — G8929 Other chronic pain: Secondary | ICD-10-CM

## 2021-06-24 DIAGNOSIS — R29898 Other symptoms and signs involving the musculoskeletal system: Secondary | ICD-10-CM | POA: Diagnosis not present

## 2021-06-24 NOTE — Therapy (Signed)
[place] ?[facility] ?[place] ?[place], [place], [address] ?Phone: [phone]   Fax:  [phone] ? ?Physical Therapy Treatment ? ?Patient Details  ?Name: Tyrone Preston ?MRN: [id number] ?Date of Birth: 11-Dec-1980 ?Referring Provider (PT): Gregor Hams, MD ? ? ?Encounter Date: 06/24/2021 ? ? PT End of Session - 06/24/21 1532   ? ? Visit Number 5   ? Date for PT Re-Evaluation 07/09/21   ? Authorization Type BCBS - VL: 60 (PT/OT/ST combined)   ? PT Start Time 1532   ? PT Stop Time 8299   ? PT Time Calculation (min) 42 min   ? Activity Tolerance Patient tolerated treatment well   ? Behavior During Therapy [facility] for tasks assessed/performed   ? ?  ?  ? ?  ? ? ?Past Medical History:  ?Diagnosis Date  ? No known health problems   ? ? ?Past Surgical History:  ?Procedure Laterality Date  ? VASECTOMY    ? WISDOM TOOTH EXTRACTION    ? ? ?There were no vitals filed for this visit. ? ? Subjective Assessment - 06/24/21 1534   ? ? Subjective Pt report no pain with the HEP but did a chest workout at the gym this morning and noted some pain in the posterior-lateral shoulder - now resolved.   ? Diagnostic tests 04/23/21 - Diagnostic Limited MSK Ultrasound of Right shoulder  Biceps tendon normal-appearing  Subscapularis tendon normal.  Supraspinatus tendon intact.  Mild subacromial bursitis present.  Infraspinatus tendon normal-appearing  AC joint mild effusion.  Impression: Subacromial bursitis;   R shoulder x-ray: No acute osseous abnormality.   ? Patient Stated Goals "no pain with sports, esp softball"   ? Currently in Pain? No/denies   ? ?  ?  ? ?  ? ? ? ? ? [facility] PT Assessment - 06/24/21 1532   ? ?  ? Assessment  ? Medical Diagnosis Chronic R shoulder pain   ? Referring Provider (PT) Gregor Hams, MD   ? Onset Date/Surgical Date --   ~6 months  ? Next MD Visit 07/04/21   ?  ? AROM  ? Right Shoulder Extension 46 Degrees   ? Right Shoulder Flexion 148 Degrees   ? Right  Shoulder ABduction 150 Degrees   ? Right Shoulder Internal Rotation 70 Degrees   ? Right Shoulder External Rotation 86 Degrees   ? Left Shoulder Extension 48 Degrees   ? Left Shoulder Flexion 152 Degrees   ? Left Shoulder ABduction 148 Degrees   ? Left Shoulder Internal Rotation 72 Degrees   ? Left Shoulder External Rotation 80 Degrees   ?  ? Strength  ? Right Shoulder Flexion 5/5   ? Right Shoulder ABduction 4/5   pain  ? Right Shoulder Internal Rotation 5/5   ? Right Shoulder External Rotation 4+/5   ? Left Shoulder Flexion 4+/5   ? Left Shoulder ABduction 5/5   ? Left Shoulder Internal Rotation 5/5   ? Left Shoulder External Rotation 4+/5   ?  ? Palpation  ? Palpation comment ttp in posterior deltoid & infraspinatus   ? ?  ?  ? ?  ? ? ? ? ? ? ? ? ? ? ? ? ? ? ? ? [place] Adult PT Treatment/Exercise - 06/24/21 1532   ? ?  ? Shoulder Exercises: ROM/Strengthening  ? UBE (Upper Arm Bike) L3.0 x 6 min (3 min each fwd & back)   ? Wall  Pushups 10 reps   ? Pushups 10 reps   ? Pushups Limitations inclined on elevated mat table   ?  ? Manual Therapy  ? Manual Therapy Soft tissue mobilization   ? Manual therapy comments skilled palpation and monitoring of soft tissue during DN   ? Soft tissue mobilization STM to R infraspinatus, posterolateral deltoid and triceps   ? ?  ?  ? ?  ? ? ? Trigger Point Dry Needling - 06/24/21 1532   ? ? Consent Given? Yes   ? Education Handout Provided Previously provided   ? Muscles Treated Upper Quadrant Infraspinatus;Deltoid;Triceps   ? Dry Needling Comments Right   ? Infraspinatus Response Twitch response elicited;Palpable increased muscle length   ? Deltoid Response Twitch response elicited;Palpable increased muscle length   posterior-lateral  ? Triceps Response Twitch response elicited;Palpable increased muscle length   ? ?  ?  ? ?  ? ? ? ? ? ? ? ? ? ? PT Short Term Goals - 06/17/21 1537   ? ?  ? PT SHORT TERM GOAL #1  ? Title Patient will be independent with initial HEP   ? Status Achieved    06/17/21  ? ?  ?  ? ?  ? ? ? ? PT Long Term Goals - 06/24/21 1537   ? ?  ? PT LONG TERM GOAL #1  ? Title Patient will be independent with ongoing/advanced HEP +/- gym program for self-management at home   ? Status Partially Met   ? Target Date 07/09/21   ?  ? PT LONG TERM GOAL #2  ? Title Improve posture and alignment with patient to demonstrate improved upright posture with posterior shoulder girdle engaged   ? Status Partially Met   ? Target Date 07/09/21   ?  ? PT LONG TERM GOAL #3  ? Title Patient to improve R shoulder AROM to WNL without pain provocation   ? Status Partially Met   ? Target Date 07/09/21   ?  ? PT LONG TERM GOAL #4  ? Title Patient will demonstrate improved R shoulder strength to 5/5 for functional UE use   ? Status Partially Met   ? Target Date 07/09/21   ?  ? PT LONG TERM GOAL #5  ? Title Patient to report ability to perform ADLs and participate in sports and leisure activities without limitation due to R shoulder pain, LOM or weakness   ? Status Partially Met   06/24/21 - no trouble with everyday activities and throwing has been better when playing softball  ? Target Date 07/09/21   ? ?  ?  ? ?  ? ? ? ? ? ? ? ? Plan - 06/24/21 1614   ? ? Clinical Impression Statement Tyrone Preston reports no pain with most daily activities but did have some pain when attempting chest workout at the gym this morning as well as some continued mild pain with throwing during softball. He reports excellent relief from DN thus far with only a few persistent tender spots in posterior R shoulder - posterolateral deltoid, infraspinatus and triceps - which were further addressed with DN and MT. Pt able to perform wall and partial incline push-ups following DN/MT with minimal to no pain reported. Encouraged continued emphasis on posterior shoulder strengthening, while easing back into chest strengthening per pain tolerance.   ? Rehab Potential Good   ? PT Frequency 2x / week   1-2x per week  ? PT Duration 6 weeks   ?  PT  Treatment/Interventions ADLs/Self Care Home Management;Cryotherapy;Electrical Stimulation;Iontophoresis 90m/ml Dexamethasone;Moist Heat;Ultrasound;Therapeutic activities;Therapeutic exercise;Neuromuscular re-education;Patient/family education;Manual techniques;Passive range of motion;Dry needling;Taping;Vasopneumatic Device;Joint Manipulations   ? PT Next Visit Plan MD PN if still seeing MD on 07/04/21; postural strengthening; R shoulder ROM; gentle R shoulder/RTC strengthening; update HEP as indicated due to 1x/wk frequency; MT +/- DN to address increased muscle tension/tightness; modalities PRN   ? PT Home Exercise Plan Access Code: [email or web address]  ? Consulted and Agree with Plan of Care Patient   ? ?  ?  ? ?  ? ? ?Patient will benefit from skilled therapeutic intervention in order to improve the following deficits and impairments:  Decreased activity tolerance, Decreased range of motion, Decreased strength, Increased fascial restricitons, Increased muscle spasms, Impaired perceived functional ability, Impaired flexibility, Impaired UE functional use, Improper body mechanics, Postural dysfunction, Pain ? ?Visit Diagnosis: ?Chronic right shoulder pain ? ?Abnormal posture ? ?Muscle weakness (generalized) ? ?Other symptoms and signs involving the musculoskeletal system ? ? ? ? ?Problem List ?Patient Active Problem List  ? Diagnosis Date Noted  ? Prostatitis 04/23/2021  ? Anal fissure 09/01/2016  ? ? ?JPercival Spanish PT ?06/24/2021, 5:16 PM ? ?[place] ?[facility] ?[address] [address]?Phone: [phone]  Fax:  [phone]? ?Name: Tyrone Preston?MRN: [id number]?Date of Birth: [birth year]? ? ? ?

## 2021-07-03 ENCOUNTER — Ambulatory Visit: Payer: BC Managed Care – PPO

## 2021-07-03 DIAGNOSIS — R29898 Other symptoms and signs involving the musculoskeletal system: Secondary | ICD-10-CM

## 2021-07-03 DIAGNOSIS — G8929 Other chronic pain: Secondary | ICD-10-CM | POA: Diagnosis not present

## 2021-07-03 DIAGNOSIS — M6281 Muscle weakness (generalized): Secondary | ICD-10-CM

## 2021-07-03 DIAGNOSIS — M25511 Pain in right shoulder: Secondary | ICD-10-CM | POA: Diagnosis not present

## 2021-07-03 DIAGNOSIS — R293 Abnormal posture: Secondary | ICD-10-CM | POA: Diagnosis not present

## 2021-07-03 NOTE — Therapy (Signed)
[place] ?[facility] ?[place] ?[place], [place], [address] ?Phone: [phone]   Fax:  [phone] ? ?Physical Therapy Treatment ? ?Patient Details  ?Name: Tyrone Preston ?MRN: [id number] ?Date of Birth: 1980/12/30 ?Referring Provider (PT): Gregor Hams, MD ? ? ?Encounter Date: 07/03/2021 ? ? PT End of Session - 07/03/21 0848   ? ? Visit Number 6   ? Date for PT Re-Evaluation 07/09/21   ? Authorization Type BCBS - VL: 60 (PT/OT/ST combined)   ? PT Start Time 0801   ? PT Stop Time [id number]   ? PT Time Calculation (min) 41 min   ? Activity Tolerance Patient tolerated treatment well   ? Behavior During Therapy [facility] for tasks assessed/performed   ? ?  ?  ? ?  ? ? ?Past Medical History:  ?Diagnosis Date  ? No known health problems   ? ? ?Past Surgical History:  ?Procedure Laterality Date  ? VASECTOMY    ? WISDOM TOOTH EXTRACTION    ? ? ?There were no vitals filed for this visit. ? ? Subjective Assessment - 07/03/21 0803   ? ? Subjective Pt reports everything is moving in the right direction, no pain this morning.   ? Diagnostic tests 04/23/21 - Diagnostic Limited MSK Ultrasound of Right shoulder  Biceps tendon normal-appearing  Subscapularis tendon normal.  Supraspinatus tendon intact.  Mild subacromial bursitis present.  Infraspinatus tendon normal-appearing  AC joint mild effusion.  Impression: Subacromial bursitis;   R shoulder x-ray: No acute osseous abnormality.   ? Patient Stated Goals "no pain with sports, esp softball"   ? Currently in Pain? No/denies   ? ?  ?  ? ?  ? ? ? ? ? [facility] PT Assessment - 07/03/21 0001   ? ?  ? Assessment  ? Next MD Visit 07/25/21   ? ?  ?  ? ?  ? ? ? ? ? ? ? ? ? ? ? ? ? ? ? ? [place] Adult PT Treatment/Exercise - 07/03/21 0001   ? ?  ? Shoulder Exercises: Prone  ? Other Prone Exercises kneeling push ups from upside down BOSU x 10   ?  ? Shoulder Exercises: Standing  ? Diagonals Strengthening;Right;10 reps;Theraband   ? Theraband Level  (Shoulder Diagonals) Level 3 (Green)   ? Diagonals Limitations D1/D2 flexion   ?  ? Shoulder Exercises: ROM/Strengthening  ? UBE (Upper Arm Bike) L4.0 x 6 min (3 min each fwd & back)   ? Lat Pull Limitations 35# seated, 15# standing extension x 10 reps each   ? Cybex Press Limitations 15# 2x10, inner grips   ? Cybex Row Limitations 35# 15 reps, cues to keep elbows to neutral   ? Plank 4 reps   10 sec  ? Plank Limitations elbows to toes   ? Other ROM/Strengthening Exercises Practicing softball pitching motion with R UE, gave cues for trunk and hip rotation with WS to reduce strain on shoulder   ? Other ROM/Strengthening Exercises static push up position with perturbations x 30   ? ?  ?  ? ?  ? ? ? ? ? ? ? ? ? ? ? ? PT Short Term Goals - 06/17/21 1537   ? ?  ? PT SHORT TERM GOAL #1  ? Title Patient will be independent with initial HEP   ? Status Achieved   06/17/21  ? ?  ?  ? ?  ? ? ? ?  PT Long Term Goals - 06/24/21 1537   ? ?  ? PT LONG TERM GOAL #1  ? Title Patient will be independent with ongoing/advanced HEP +/- gym program for self-management at home   ? Status Partially Met   ? Target Date 07/09/21   ?  ? PT LONG TERM GOAL #2  ? Title Improve posture and alignment with patient to demonstrate improved upright posture with posterior shoulder girdle engaged   ? Status Partially Met   ? Target Date 07/09/21   ?  ? PT LONG TERM GOAL #3  ? Title Patient to improve R shoulder AROM to WNL without pain provocation   ? Status Partially Met   ? Target Date 07/09/21   ?  ? PT LONG TERM GOAL #4  ? Title Patient will demonstrate improved R shoulder strength to 5/5 for functional UE use   ? Status Partially Met   ? Target Date 07/09/21   ?  ? PT LONG TERM GOAL #5  ? Title Patient to report ability to perform ADLs and participate in sports and leisure activities without limitation due to R shoulder pain, LOM or weakness   ? Status Partially Met   06/24/21 - no trouble with everyday activities and throwing has been better when  playing softball  ? Target Date 07/09/21   ? ?  ?  ? ?  ? ? ? ? ? ? ? ? Plan - 07/03/21 0849   ? ? Clinical Impression Statement Pt noted that he is now seeing Dr. Georgina Snell on 07/25/21, as he is finishing up PT next visit and wishes to see MD afterwards. We reviewed throwing motions today, provided inctruction on incoporating more trunk into his throw as he only uses shoulders. Reviewed gym equipement, adjusting arm position to reduce strain on the R [facility] joint. Pt noted a good home regimen now, he feels ready to transition to HEP next visit.   ? Personal Factors and Comorbidities Time since onset of injury/illness/exacerbation;Profession   ? PT Frequency 2x / week   1-2x per week  ? PT Duration 6 weeks   ? PT Treatment/Interventions ADLs/Self Care Home Management;Cryotherapy;Electrical Stimulation;Iontophoresis 40m/ml Dexamethasone;Moist Heat;Ultrasound;Therapeutic activities;Therapeutic exercise;Neuromuscular re-education;Patient/family education;Manual techniques;Passive range of motion;Dry needling;Taping;Vasopneumatic Device;Joint Manipulations   ? PT Next Visit Plan plan for D/C, PN   ? PT Home Exercise Plan Access Code: [email or web address]  ? Consulted and Agree with Plan of Care Patient   ? ?  ?  ? ?  ? ? ?Patient will benefit from skilled therapeutic intervention in order to improve the following deficits and impairments:  Decreased activity tolerance, Decreased range of motion, Decreased strength, Increased fascial restricitons, Increased muscle spasms, Impaired perceived functional ability, Impaired flexibility, Impaired UE functional use, Improper body mechanics, Postural dysfunction, Pain ? ?Visit Diagnosis: ?Chronic right shoulder pain ? ?Abnormal posture ? ?Muscle weakness (generalized) ? ?Other symptoms and signs involving the musculoskeletal system ? ? ? ? ?Problem List ?Patient Active Problem List  ? Diagnosis Date Noted  ? Prostatitis 04/23/2021  ? Anal fissure 09/01/2016  ? ? ?BArtist Pais PTA ?07/03/2021,  9:13 AM ? ?[place] ?[facility] ?[address] [address]?Phone: [phone]  Fax:  [phone]? ?Name: JTERAN KNITTLE?MRN: [id number]?Date of Birth: 6August 06, 1982? ? ? ?

## 2021-07-04 ENCOUNTER — Ambulatory Visit: Payer: BC Managed Care – PPO | Admitting: Family Medicine

## 2021-07-04 DIAGNOSIS — D1801 Hemangioma of skin and subcutaneous tissue: Secondary | ICD-10-CM | POA: Diagnosis not present

## 2021-07-04 DIAGNOSIS — L814 Other melanin hyperpigmentation: Secondary | ICD-10-CM | POA: Diagnosis not present

## 2021-07-04 DIAGNOSIS — D2272 Melanocytic nevi of left lower limb, including hip: Secondary | ICD-10-CM | POA: Diagnosis not present

## 2021-07-04 DIAGNOSIS — X32XXXS Exposure to sunlight, sequela: Secondary | ICD-10-CM | POA: Diagnosis not present

## 2021-07-09 ENCOUNTER — Ambulatory Visit: Payer: BC Managed Care – PPO | Attending: Family Medicine | Admitting: Physical Therapy

## 2021-07-09 ENCOUNTER — Encounter: Payer: Self-pay | Admitting: Physical Therapy

## 2021-07-09 DIAGNOSIS — M6281 Muscle weakness (generalized): Secondary | ICD-10-CM | POA: Insufficient documentation

## 2021-07-09 DIAGNOSIS — R29898 Other symptoms and signs involving the musculoskeletal system: Secondary | ICD-10-CM | POA: Insufficient documentation

## 2021-07-09 DIAGNOSIS — R293 Abnormal posture: Secondary | ICD-10-CM | POA: Insufficient documentation

## 2021-07-09 DIAGNOSIS — M25511 Pain in right shoulder: Secondary | ICD-10-CM | POA: Diagnosis not present

## 2021-07-09 DIAGNOSIS — G8929 Other chronic pain: Secondary | ICD-10-CM | POA: Insufficient documentation

## 2021-07-09 NOTE — Therapy (Addendum)
[facility] [address]  [place], [place], [address] Phone: [phone]   Fax:  [phone]  Physical Therapy Treatment / Progress Note / Discharge Summary  Patient Details  Name: Tyrone Preston MRN: [id number] Date of Birth: Jul 23, 1980 Referring Provider (PT): Gregor Hams, MD  Progress Note  Reporting Period 05/28/2021 to 07/09/2021  See note below for Objective Data and Assessment of Progress/Goals.     Encounter Date: 07/09/2021   PT End of Session - 07/09/21 1621     Visit Number 7    Date for PT Re-Evaluation 07/09/21    Authorization Type BCBS - VL: 60 (PT/OT/ST combined)    PT Start Time 1621    PT Stop Time 1705    PT Time Calculation (min) 44 min    Activity Tolerance Patient tolerated treatment well    Behavior During Therapy WFL for tasks assessed/performed             Past Medical History:  Diagnosis Date   No known health problems     Past Surgical History:  Procedure Laterality Date   VASECTOMY     WISDOM TOOTH EXTRACTION      There were no vitals filed for this visit.   Subjective Assessment - 07/09/21 1623     Subjective Pt reports his shoulder feels like a toothache today - just annoying. May have slept on it wrong. Pain usually has just been isolated to usage.    Diagnostic tests 04/23/21 - Diagnostic Limited MSK Ultrasound of Right shoulder  Biceps tendon normal-appearing  Subscapularis tendon normal.  Supraspinatus tendon intact.  Mild subacromial bursitis present.  Infraspinatus tendon normal-appearing  AC joint mild effusion.  Impression: Subacromial bursitis;   R shoulder x-ray: No acute osseous abnormality.    Patient Stated Goals "no pain with sports, esp softball"    Currently in Pain? Yes    Pain Score 3    2-3/10   Pain Location Arm    Pain Orientation Right;Upper    Pain Descriptors / Indicators Aching    Pain Type Chronic pain                [facility] PT  Assessment - 07/09/21 1621       Assessment   Medical Diagnosis Chronic R shoulder pain    Referring Provider (PT) Gregor Hams, MD    Onset Date/Surgical Date --   ~6 months   Next MD Visit 07/25/21      Observation/Other Assessments   Focus on Therapeutic Outcomes (FOTO)  Shoulder: FS = 70      AROM   Right Shoulder Flexion 157 Degrees    Right Shoulder ABduction 156 Degrees    Right Shoulder Internal Rotation 76 Degrees    Right Shoulder External Rotation 88 Degrees      Strength   Right Shoulder Flexion 5/5    Right Shoulder ABduction 4/5   pain   Right Shoulder Internal Rotation 5/5    Right Shoulder External Rotation 5/5    Left Shoulder Flexion 5/5    Left Shoulder ABduction 5/5    Left Shoulder Internal Rotation 5/5    Left Shoulder External Rotation 5/5                           [facility] Adult PT Treatment/Exercise - 07/09/21 1621       Shoulder Exercises: Standing   ABduction Right;10 reps;Strengthening;Theraband  2 sets   Theraband Level (Shoulder ABduction) Level 2 (Red)    ABduction Limitations scaption    Diagonals Right;10 reps;Strengthening;Theraband    Theraband Level (Shoulder Diagonals) Level 3 (Green)    Diagonals Limitations D1/D2 flexion & exttension      Shoulder Exercises: ROM/Strengthening   UBE (Upper Arm Bike) L4.0 x 6 min (3 min each fwd & back)      Manual Therapy   Manual Therapy Soft tissue mobilization    Manual therapy comments skilled palpation and monitoring of soft tissue during DN    Soft tissue mobilization STM to R anterior and lateral deltoid              Trigger Point Dry Needling - 07/09/21 1621     Consent Given? Yes    Education Handout Provided Previously provided    Muscles Treated Upper Quadrant Deltoid    Dry Needling Comments Right    Deltoid Response Twitch response elicited;Palpable increased muscle length   anterior and lateral                  PT Education - 07/09/21 1704      Education Details HEP review & update; 30-day hold process    Person(s) Educated Patient    Methods Explanation;Demonstration;Verbal cues    Comprehension Verbalized understanding;Verbal cues required;Returned demonstration              PT Short Term Goals - 06/17/21 1537       PT SHORT TERM GOAL #1   Title Patient will be independent with initial HEP    Status Achieved   06/17/21              PT Long Term Goals - 07/09/21 1633       PT LONG TERM GOAL #1   Title Patient will be independent with ongoing/advanced HEP +/- gym program for self-management at home    Status Achieved   07/09/21   Target Date 07/09/21      PT LONG TERM GOAL #2   Title Improve posture and alignment with patient to demonstrate improved upright posture with posterior shoulder girdle engaged    Status Achieved   07/09/21   Target Date 07/09/21      PT LONG TERM GOAL #3   Title Patient to improve R shoulder AROM to WNL without pain provocation    Status Achieved   07/09/21   Target Date 07/09/21      PT LONG TERM GOAL #4   Title Patient will demonstrate improved R shoulder strength to 5/5 for functional UE use    Status Partially Met   07/09/21 - met except R shoulder ABD 4/5 due to pain   Target Date 07/09/21      PT LONG TERM GOAL #5   Title Patient to report ability to perform ADLs and participate in sports and leisure activities without limitation due to R shoulder pain, LOM or weakness    Status Partially Met   07/09/21 - Pt still notes some pain in R shoulder when playing softball until he warms up but no lingering pain after playing; still has difficulty with heavy lifting with R UE   Target Date 07/09/21                   Plan - 07/09/21 1705     Clinical Impression Statement Tyrone Preston feels like the exercises have been helping and reports no R shoulder pain with most activities other than  some pain with heavy lifting or mild pain initially until he has warmed up when playing softball but  denies any lingering pain. He did note an achy feeling today w/o known trigger other than potentially sleeping wrong on his shoulder. R shoulder AROM now WNL and strength 5/5 for all motions except ABD (4/5 due to pain with resisted MMT). Some increased muscle tension/TTP still evident in the R anterior and lateral deltoids which was addressed with manual therapy and DN today but otherwise no other localized tenderness noted. He reports the HEP has been going well and he feels comfortable with all of the exercises including some of the more recent progressions which were added to his HEP today and he thinks he will be okay to continue on his own with the HEP. Goals mostly met with the exceptions as indicated in the goal assessment. Tyrone Preston would like to try transitioning to his HEP but will remain on hold for 30 days in the event that issues arise with the HEP or upon his f/u with the MD on 07/25/21.    PT Treatment/Interventions ADLs/Self Care Home Management;Cryotherapy;Electrical Stimulation;Iontophoresis 69m/ml Dexamethasone;Moist Heat;Ultrasound;Therapeutic activities;Therapeutic exercise;Neuromuscular re-education;Patient/family education;Manual techniques;Passive range of motion;Dry needling;Taping;Vasopneumatic Device;Joint Manipulations    PT Next Visit Plan transition to HEP + 30-day hold    PT Home Exercise Plan Access Code: [email or web address]   Consulted and Agree with Plan of Care Patient             Patient will benefit from skilled therapeutic intervention in order to improve the following deficits and impairments:  Decreased activity tolerance, Decreased range of motion, Decreased strength, Increased fascial restricitons, Increased muscle spasms, Impaired perceived functional ability, Impaired flexibility, Impaired UE functional use, Improper body mechanics, Postural dysfunction, Pain  Visit Diagnosis: Chronic right shoulder pain  Abnormal posture  Muscle weakness (generalized)  Other  symptoms and signs involving the musculoskeletal system     Problem List Patient Active Problem List   Diagnosis Date Noted   Prostatitis 04/23/2021   Anal fissure 09/01/2016    JPercival Spanish PT 07/09/2021, 6:36 PM  [facility] [address] [place] [address]Phone: [phone]  Fax:  [phone] Name: JMORT SMELSERMRN: [id number] of Birth: [birth year]  PHYSICAL THERAPY DISCHARGE SUMMARY  Visits from Start of Care: 7  Current functional level related to goals / functional outcomes:   Refer to above clinical impression for status as of last visit on 07/09/2021. Patient was placed on hold for 30 days and has not needed to return to PT, therefore will proceed with discharge from PT for this episode.   Remaining deficits:   As above.   Education / Equipment:   HEP   Patient agrees to discharge. Patient goals were partially met. Patient is being discharged due to being pleased with the current functional level.  JPercival Spanish PT, MPT 08/15/21, 5:30 PM  [facility] [place] [address]Phone: [phone]  Fax:  [phone]

## 2021-07-09 NOTE — Patient Instructions (Signed)
? ? ?  Access Code: [email or web address] ?URL: [email or web address] ?Date: 07/09/2021 ?Prepared by: Glenetta Hew ? ?Exercises ?- Doorway Pec Stretch at 60 Degrees Abduction with Arm Straight  - 2-3 x daily - 7 x weekly - 3 reps - 30 sec hold ?- Doorway Pec Stretch at 90 Degrees Abduction  - 2-3 x daily - 7 x weekly - 3 reps - 30 sec hold ?- Doorway Pec Stretch at 120 Degrees Abduction  - 2-3 x daily - 7 x weekly - 3 reps - 30 sec hold ?- Sidelying Open Book Thoracic Lumbar Rotation and Extension  - 1-2 x daily - 7 x weekly - 10 reps - 5 sec hold ?- Standing Shoulder Row with Anchored Resistance  - 1 x daily - 7 x weekly - 2 sets - 10 reps - 5 sec hold ?- Scapular Retraction with Resistance Advanced  - 1 x daily - 7 x weekly - 2 sets - 10 reps - 5 sec hold ?- Standing Shoulder Horizontal Abduction with Anchored Resistance  - 1 x daily - 3-4 x weekly - 3 sets - 10 reps ?- Shoulder External Rotation with Anchored Resistance  - 1 x daily - 3-4 x weekly - 3 sets - 10 reps ?- Single Arm Scaption with Resistance  - 1 x daily - 3-4 x weekly - 2 sets - 10 reps - 3 sec hold ?- Standing Single Arm Shoulder PNF D1 Flexion with Anchored Resistance  - 1 x daily - 3-4 x weekly - 2 sets - 10 reps - 3 sec hold ?- Standing Shoulder Single Arm PNF D2 Flexion with Anchored Resistance  - 1 x daily - 3-4 x weekly - 2 sets - 10 reps - 3 sec hold ?- Standing Single Arm Shoulder PNF D1 Extension with Anchored Resistance  - 1 x daily - 3-4 x weekly - 2 sets - 10 reps - 3 sec hold ?- Standing Shoulder Single Arm PNF D2 Extension with Anchored Resistance  - 1 x daily - 3-4 x weekly - 2 sets - 10 reps - 3 sec hold ? ?Patient Education ?- Trigger Point Dry Needling ?

## 2021-07-25 ENCOUNTER — Encounter: Payer: Self-pay | Admitting: Family Medicine

## 2021-07-25 ENCOUNTER — Ambulatory Visit (INDEPENDENT_AMBULATORY_CARE_PROVIDER_SITE_OTHER): Payer: BC Managed Care – PPO | Admitting: Family Medicine

## 2021-07-25 VITALS — BP 100/64 | HR 55 | Ht 72.0 in | Wt 172.2 lb

## 2021-07-25 DIAGNOSIS — M25511 Pain in right shoulder: Secondary | ICD-10-CM

## 2021-07-25 DIAGNOSIS — G8929 Other chronic pain: Secondary | ICD-10-CM | POA: Diagnosis not present

## 2021-07-25 NOTE — Patient Instructions (Addendum)
Good to see you today.  You should hear from MRI scheduling within 1 week. If you do not hear please let me know.    We will figure out next steps based on MRI  Likely injection.

## 2021-07-25 NOTE — Progress Notes (Signed)
   I, Tyrone Preston, LAT, ATC, am serving as scribe for Dr. Clementeen Graham.  Tyrone Preston is a 41 y.o. male who presents to [facility] at [facility] today for f/u R shoulder pain due to subacromial impingement and bursitis. 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 Pt was last seen by Dr. Denyse Amass on 04/23/21 and was referred to PT, completing 7 visits. Today, pt reports that his R shoulder is better but not pain-free.  He rates his improvement at 50%.  He did quite a bit of dry needling at PT.  He con't to play competitive SB on the weekends.  Dx imaging: 04/23/21 R shoulder XR  Pertinent review of systems: No fevers or chills  Relevant historical information: History of prostatitis.  Otherwise healthy.  Psychologist, educational at [employer].   Exam:  BP 100/64 (BP Location: Right Arm, Patient Position: Sitting, Cuff Size: Normal)   Pulse (!) 55   Ht 6' (1.829 m)   Wt 172 lb 3.2 oz (78.1 kg)   SpO2 98%   BMI 23.35 kg/m  General: Well Developed, well nourished, and in no acute distress.   MSK: Right shoulder normal-appearing well-developed musculature. Normal motion some pain with abduction.  Positive Hawkins and Neer's test.  Positive O'Brien's test.  Intact strength.    Lab and Radiology Results  EXAM: RIGHT SHOULDER - 2+ VIEW   COMPARISON:  None.   FINDINGS: Normal alignment. No acute fracture. No significant arthropathy appreciated. Normal mineralization. The soft tissues are unremarkable.   IMPRESSION: No acute osseous abnormality.     Electronically Signed   By: Olive Bass M.D.   On: 04/24/2021 09:14   I, Clementeen Graham, personally (independently) visualized and performed the interpretation of the images attached in this note.     Assessment and Plan: 41 y.o. male with persistent right shoulder pain.  Pain thought to be due to rotator cuff tendinopathy or perhaps even rotator cuff tear. [name] has done very well with physical therapy.  He is  given this a really good try with dedicated physical therapy and home exercise program.  Unfortunately he is only about 50% better which is not acceptable.  His pain is interfering with normal quality of life activities and with exercise.  Plan for MRI to further evaluate and determine source of pain and to determine future treatment plan and options including injection or surgery.  Likely recheck after MRI unless it is a clear surgical indication.  Injection likely will be the next step but MRI which should help determine that.   PDMP not reviewed this encounter. Orders Placed This Encounter  Procedures   MR SHOULDER RIGHT WO CONTRAST    Standing Status:   Future    Standing Expiration Date:   07/26/2022    Order Specific Question:   What is the patient's sedation requirement?    Answer:   No Sedation    Order Specific Question:   Does the patient have a pacemaker or implanted devices?    Answer:   No    Order Specific Question:   Preferred imaging location?    Answer:   [facility] (table limit-350lbs)   No orders of the defined types were placed in this encounter.    Discussed warning signs or symptoms. Please see discharge instructions. Patient expresses understanding.   The above documentation has been reviewed and is accurate and complete Clementeen Graham, M.D.

## 2021-08-19 ENCOUNTER — Ambulatory Visit (INDEPENDENT_AMBULATORY_CARE_PROVIDER_SITE_OTHER): Payer: BC Managed Care – PPO

## 2021-08-19 DIAGNOSIS — M19011 Primary osteoarthritis, right shoulder: Secondary | ICD-10-CM | POA: Diagnosis not present

## 2021-08-19 DIAGNOSIS — G8929 Other chronic pain: Secondary | ICD-10-CM | POA: Diagnosis not present

## 2021-08-19 DIAGNOSIS — R600 Localized edema: Secondary | ICD-10-CM | POA: Diagnosis not present

## 2021-08-19 DIAGNOSIS — M75111 Incomplete rotator cuff tear or rupture of right shoulder, not specified as traumatic: Secondary | ICD-10-CM | POA: Diagnosis not present

## 2021-08-19 DIAGNOSIS — M25511 Pain in right shoulder: Secondary | ICD-10-CM

## 2021-08-21 NOTE — Progress Notes (Signed)
Right shoulder MRI shows rotator cuff tendinitis with tiny partial rotator cuff tears.  An injection here could be helpful.  Recommend return to clinic to go over the results in full detail and discuss treatment plan and options and potentially proceed with injection.

## 2021-08-26 ENCOUNTER — Ambulatory Visit: Payer: Self-pay

## 2021-08-26 ENCOUNTER — Ambulatory Visit (INDEPENDENT_AMBULATORY_CARE_PROVIDER_SITE_OTHER): Payer: BC Managed Care – PPO | Admitting: Family Medicine

## 2021-08-26 VITALS — BP 112/78 | HR 45 | Ht 72.0 in | Wt 172.0 lb

## 2021-08-26 DIAGNOSIS — F40298 Other specified phobia: Secondary | ICD-10-CM | POA: Diagnosis not present

## 2021-08-26 DIAGNOSIS — M25511 Pain in right shoulder: Secondary | ICD-10-CM | POA: Diagnosis not present

## 2021-08-26 DIAGNOSIS — G8929 Other chronic pain: Secondary | ICD-10-CM | POA: Diagnosis not present

## 2021-08-26 NOTE — Patient Instructions (Signed)
Good to see you today.  You had a R shoulder injection.  Call or go to the ER if you develop a large red swollen joint with extreme pain or oozing puss.   Con't your home exercises.  If not improving, let me know and I can refer you to PT.  Follow-up as needed.

## 2021-08-26 NOTE — Progress Notes (Signed)
   I, Philbert Riser, LAT, ATC acting as a scribe for Clementeen Graham, MD.  Tyrone Preston is a 41 y.o. male who presents to [facility] at [facility] today for f/u R shoulder pain and MRI review. 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 Pt previously had a course of PT, completing 7 visits. Pt was last seen by Dr. Denyse Amass on 07/25/21 and was advised to proceed to MRI to further evaluate the source of his pain. Today, pt reports no change or improvement in his R shoulder pain.   Dx imaging: 08/19/21 R shoulder MRI  04/23/21 R shoulder XR  Pertinent review of systems: ***  Relevant historical information: ***   Exam:  There were no vitals taken for this visit. General: Well Developed, well nourished, and in no acute distress.   MSK: ***    Lab and Radiology Results No results found for this or any previous visit (from the past 72 hour(s)). No results found.     Assessment and Plan: 41 y.o. male with ***   PDMP not reviewed this encounter. No orders of the defined types were placed in this encounter.  No orders of the defined types were placed in this encounter.    Discussed warning signs or symptoms. Please see discharge instructions. Patient expresses understanding.   ***

## 2021-08-27 NOTE — Progress Notes (Signed)
I, Philbert Riser, LAT, ATC acting as a scribe for Clementeen Graham, MD.  Tyrone Preston is a 41 y.o. male who presents to [facility] at [facility] today for f/u R shoulder pain and MRI review. 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 Pt previously had a course of PT, completing 7 visits. Pt was last seen by Dr. Denyse Amass on 07/25/21 and was advised to proceed to MRI to further evaluate the source of his pain. Today, pt reports no change or improvement in his R shoulder pain.   Dx imaging: 08/19/21 R shoulder MRI  04/23/21 R shoulder XR  Pertinent review of systems: no fever or chills  Relevant historical information: Needle phobia.  Has passed out with generations before.   Exam:  BP 112/78   Pulse (!) 45   Ht 6' (1.829 m)   Wt 172 lb (78 kg)   SpO2 98%   BMI 23.33 kg/m  General: Well Developed, well nourished, and in no acute distress.   MSK: Right shoulder: Normal-appearing normal motion pain with abduction.    Lab and Radiology Results  Procedure: Real-time Ultrasound Guided Injection of right shoulder subacromial bursa Device: Philips Affiniti 50G Images permanently stored and available for review in PACS Verbal informed consent obtained.  Discussed risks and benefits of procedure. Warned about infection, bleeding, hyperglycemia damage to structures among others. Patient expresses understanding and agreement Time-out conducted.   Noted no overlying erythema, induration, or other signs of local infection.   Skin prepped in a sterile fashion.   Local anesthesia: Topical Ethyl chloride.   With sterile technique and under real time ultrasound guidance: 40 mg of Kenalog and 2 mL of Marcaine injected into subacromial bursa. Fluid seen entering the bursa.   Completed without difficulty   Pain immediately resolved suggesting accurate placement of the medication.   Advised to call if fevers/chills, erythema, induration, drainage, or persistent  bleeding.   Images permanently stored and available for review in the ultrasound unit.  Impression: Technically successful ultrasound guided injection.    EXAM: MRI OF THE RIGHT SHOULDER WITHOUT CONTRAST   TECHNIQUE: Multiplanar, multisequence MR imaging of the shoulder was performed. No intravenous contrast was administered.   COMPARISON:  Right shoulder radiographs 04/23/2021   FINDINGS: Rotator cuff: There is moderate subcortical cystic change within the humeral head deep to the anterior infraspinatus and posterior supraspinatus tendon fibers. Moderate infraspinatus greater than supraspinatus intermediate T2 signal tendinosis in this region with a tiny midsubstance partial-thickness tear measuring up to 2 mm in AP dimension (sagittal series 6, image 16, 4 mm in transverse dimension, and 10 mm along the longitudinal length of the tendon (coronal series 4, image 9). No tendon retraction. The subscapularis and teres minor are intact.   Muscles: No rotator cuff muscle atrophy, fatty infiltration, or edema.   Biceps long head: The intra-articular long head of the biceps tendon is intact.   Acromioclavicular Joint: There are mild degenerative changes of the acromioclavicular joint including joint space narrowing and peripheral osteophytosis. Type II acromion.   Mild edema but no frank fluid within the subacromial/subdeltoid bursa.   Glenohumeral Joint: Mild glenoid and humeral head cartilage thinning.   Labrum: Grossly intact, but evaluation is limited by lack of intraarticular fluid.   Bones:  No acute fracture.   Other: None.   IMPRESSION: 1. Moderate posterior supraspinatus and anterior infraspinatus tendinosis with associated midsubstance tiny partial-thickness tendon tear but no tendon retraction. 2. Mild degenerative changes of the  acromioclavicular joint.     Electronically Signed   By: Neita Garnet M.D.   On: 08/20/2021 18:03 I, Clementeen Graham, personally  (independently) visualized and performed the interpretation of the images attached in this note.      Assessment and Plan: 41 y.o. male with right shoulder pain due to impingement and rotator cuff tendinitis/small tear and bursitis.  Plan for subacromial injection today.  He is already completed a good trial of physical therapy.  If injection plus PT is not sufficient would recommend surgical consultation for decompression surgery.   PDMP not reviewed this encounter. Orders Placed This Encounter  Procedures   [place] LIMITED JOINT SPACE STRUCTURES UP RIGHT(NO LINKED CHARGES)    Order Specific Question:   Reason for Exam (SYMPTOM  OR DIAGNOSIS REQUIRED)    Answer:   rt shoulde rinj    Order Specific Question:   Preferred imaging location?    Answer:   [facility]    No orders of the defined types were placed in this encounter.    Discussed warning signs or symptoms. Please see discharge instructions. Patient expresses understanding.   The above documentation has been reviewed and is accurate and complete Clementeen Graham, M.D.

## 2021-12-27 ENCOUNTER — Encounter: Payer: Self-pay | Admitting: Family Medicine

## 2021-12-27 ENCOUNTER — Ambulatory Visit (INDEPENDENT_AMBULATORY_CARE_PROVIDER_SITE_OTHER): Payer: BC Managed Care – PPO | Admitting: Family Medicine

## 2021-12-27 VITALS — BP 110/76 | HR 49 | Temp 98.1°F | Ht 72.0 in | Wt 171.0 lb

## 2021-12-27 DIAGNOSIS — R591 Generalized enlarged lymph nodes: Secondary | ICD-10-CM | POA: Diagnosis not present

## 2021-12-27 NOTE — Progress Notes (Signed)
Chief Complaint  Patient presents with   Groin Pain    Tyrone Preston is a 41 y.o. male here for a skin complaint.  Duration: 4 days Location: R groin Pruritic? No Painful? Yes Drainage? No No trauma or inciting event.  Other associated symptoms: no fevers, testicular pain, bruising, redness Things have improved.  Therapies tried thus far: ice, NSAIDs  Past Medical History:  Diagnosis Date   No known health problems     BP 110/76 (BP Location: Left Arm, Patient Position: Sitting, Cuff Size: Normal)   Pulse (!) 49   Temp 98.1 F (36.7 C) (Oral)   Ht 6' (1.829 m)   Wt 171 lb (77.6 kg)   SpO2 99%   BMI 23.19 kg/m  Gen: awake, alert, appearing stated age Lungs: No accessory muscle use Skin: 2.5 cm x 1 cm rubbery nodule in inguinal region resembling a lymph node, mild ttp. No drainage, erythema, fluctuance, excoriation GU: No ext lesions, no hernia on R, small inguinal hernia on L Psych: Age appropriate judgment and insight  Lymphadenopathy - Plan: CBC  Ice, Tylenol, NSAIDs, ck CBC. If it continues to resolve, great, if not will ck imaging.  F/u prn. The patient voiced understanding and agreement to the plan.  [place], DO 12/27/21 3:32 PM

## 2021-12-27 NOTE — Patient Instructions (Signed)
Give [place] 2-3 business days to get the results of your labs back.   Ice/cold pack over area for 10-15 min twice daily.  OK to take Tylenol 1000 mg (2 extra strength tabs) or 975 mg (3 regular strength tabs) every 6 hours as needed.  Ibuprofen 400-600 mg (2-3 over the counter strength tabs) every 6 hours as needed for pain.  Let us know if you need anything.

## 2021-12-30 ENCOUNTER — Other Ambulatory Visit: Payer: Self-pay | Admitting: Family Medicine

## 2021-12-30 DIAGNOSIS — R591 Generalized enlarged lymph nodes: Secondary | ICD-10-CM

## 2021-12-30 LAB — EXTRA SPECIMEN

## 2021-12-30 LAB — CBC

## 2021-12-31 ENCOUNTER — Other Ambulatory Visit (INDEPENDENT_AMBULATORY_CARE_PROVIDER_SITE_OTHER): Payer: BC Managed Care – PPO

## 2021-12-31 DIAGNOSIS — R591 Generalized enlarged lymph nodes: Secondary | ICD-10-CM | POA: Diagnosis not present

## 2021-12-31 LAB — CBC
HCT: 38.6 % — ABNORMAL LOW (ref 39.0–52.0)
Hemoglobin: 13.3 g/dL (ref 13.0–17.0)
MCHC: 34.4 g/dL (ref 30.0–36.0)
MCV: 88 fl (ref 78.0–100.0)
Platelets: 154 10*3/uL (ref 150.0–400.0)
RBC: 4.38 Mil/uL (ref 4.22–5.81)
RDW: 12.8 % (ref 11.5–15.5)
WBC: 7.5 10*3/uL (ref 4.0–10.5)

## 2022-04-21 ENCOUNTER — Ambulatory Visit (INDEPENDENT_AMBULATORY_CARE_PROVIDER_SITE_OTHER): Payer: BC Managed Care – PPO | Admitting: Family Medicine

## 2022-04-21 ENCOUNTER — Encounter: Payer: Self-pay | Admitting: Family Medicine

## 2022-04-21 VITALS — BP 128/70 | HR 77 | Temp 98.0°F | Ht 72.0 in | Wt 173.4 lb

## 2022-04-21 DIAGNOSIS — Z125 Encounter for screening for malignant neoplasm of prostate: Secondary | ICD-10-CM

## 2022-04-21 DIAGNOSIS — Z0000 Encounter for general adult medical examination without abnormal findings: Secondary | ICD-10-CM | POA: Diagnosis not present

## 2022-04-21 NOTE — Progress Notes (Signed)
Chief Complaint  Patient presents with   Annual Exam    Well Male Tyrone Preston is here for a complete physical.   His last physical was >1 year ago.  Current diet: in general, a "healthy" diet.   Current exercise: lifting wts, running Weight trend: stable Fatigue out of ordinary? No. Seat belt? Yes.   Advanced directive? No  Health maintenance Tetanus- Yes HIV- Yes Hep C- Yes  Past Medical History:  Diagnosis Date   No known health problems     Past Surgical History:  Procedure Laterality Date   VASECTOMY     WISDOM TOOTH EXTRACTION     Medications  Takes no meds routinely.    Allergies No Known Allergies  Family History Family History  Problem Relation Age of Onset   Hyperlipidemia Mother    Diabetes Mother    Cancer Father        prostate cancer   Cancer Paternal Grandfather        prostate cancer   Cancer Paternal Uncle         prostate cancer    Review of Systems: Constitutional: no fevers or chills Eye:  no recent significant change in vision Ear/Nose/Mouth/Throat:  Ears:  no hearing loss Nose/Mouth/Throat:  no complaints of nasal congestion, no sore throat Cardiovascular:  no chest pain Respiratory:  no shortness of breath Gastrointestinal:  no abdominal pain, no change in bowel habits GU:  Male: negative for dysuria, frequency, and incontinence Musculoskeletal/Extremities:  no pain of the joints Integumentary (Skin/Breast):  no abnormal skin lesions reported Neurologic:  no headaches Endocrine: No unexpected weight changes Hematologic/Lymphatic:  no night sweats  Exam BP 128/70 (BP Location: Right Arm, Patient Position: Sitting, Cuff Size: Normal)   Pulse 77   Temp 98 F (36.7 C) (Oral)   Ht 6' (1.829 m)   Wt 173 lb 6 oz (78.6 kg)   SpO2 99%   BMI 23.51 kg/m  General:  well developed, well nourished, in no apparent distress Skin:  no significant moles, warts, or growths Head:  no masses, lesions, or tenderness Eyes:  pupils equal  and round, sclera anicteric without injection Ears:  canals without lesions, TMs shiny without retraction, no obvious effusion, no erythema Nose:  nares patent, mucosa normal Throat/Pharynx:  lips and gingiva without lesion; tongue and uvula midline; non-inflamed pharynx; no exudates or postnasal drainage Neck: neck supple without adenopathy, thyromegaly, or masses Lungs:  clear to auscultation, breath sounds equal bilaterally, no respiratory distress Cardio:  regular rate and rhythm, no bruits, no LE edema Abdomen:  abdomen soft, nontender; bowel sounds normal; no masses or organomegaly Rectal: Deferred Musculoskeletal:  symmetrical muscle groups noted without atrophy or deformity Extremities:  no clubbing, cyanosis, or edema, no deformities, no skin discoloration Neuro:  gait normal; deep tendon reflexes normal and symmetric Psych: well oriented with normal range of affect and appropriate judgment/insight  Assessment and Plan  Well adult exam - Plan: CBC, Comprehensive metabolic panel, Lipid panel  Screening for prostate cancer - Plan: PSA   Well 42 y.o. male. Counseled on diet and exercise. Counseled on risks and benefits of prostate cancer screening with PSA. The patient agrees to undergo screening.  Advanced directive form provided today.  Other orders as above. Follow up in 1 yr pending the above workup. The patient voiced understanding and agreement to the plan.  [place], DO 04/21/22 2:57 PM

## 2022-04-21 NOTE — Patient Instructions (Signed)
Give us 2-3 business days to get the results of your labs back.   Keep the diet clean and stay active.  Please get me a copy of your advanced directive form at your convenience.   Let us know if you need anything.  

## 2022-04-22 ENCOUNTER — Other Ambulatory Visit: Payer: Self-pay | Admitting: Family Medicine

## 2022-04-22 DIAGNOSIS — N289 Disorder of kidney and ureter, unspecified: Secondary | ICD-10-CM

## 2022-04-22 DIAGNOSIS — E785 Hyperlipidemia, unspecified: Secondary | ICD-10-CM

## 2022-04-22 LAB — LIPID PANEL
Cholesterol: 220 mg/dL — ABNORMAL HIGH (ref 0–200)
HDL: 57.2 mg/dL (ref >39.00)
LDL Cholesterol: 132 mg/dL — ABNORMAL HIGH (ref 0–99)
NonHDL: 162.84
Total CHOL/HDL Ratio: 4
Triglycerides: 156 mg/dL — ABNORMAL HIGH (ref 0.0–149.0)
VLDL: 31.2 mg/dL (ref 0.0–40.0)

## 2022-04-22 LAB — COMPREHENSIVE METABOLIC PANEL
ALT: 20 U/L (ref 0–53)
AST: 22 U/L (ref 0–37)
Albumin: 4.5 g/dL (ref 3.5–5.2)
Alkaline Phosphatase: 38 U/L — ABNORMAL LOW (ref 39–117)
BUN: 23 mg/dL (ref 6–23)
CO2: 32 mEq/L (ref 19–32)
Calcium: 9.4 mg/dL (ref 8.4–10.5)
Chloride: 100 mEq/L (ref 96–112)
Creatinine, Ser: 2.03 mg/dL — ABNORMAL HIGH (ref 0.40–1.50)
GFR: 39.38 mL/min — ABNORMAL LOW (ref >60.00)
Glucose, Bld: 95 mg/dL (ref 70–99)
Potassium: 3.9 mEq/L (ref 3.5–5.1)
Sodium: 139 mEq/L (ref 135–145)
Total Bilirubin: 0.4 mg/dL (ref 0.2–1.2)
Total Protein: 6.7 g/dL (ref 6.0–8.3)

## 2022-04-22 LAB — CBC
HCT: 43.4 % (ref 39.0–52.0)
Hemoglobin: 14.9 g/dL (ref 13.0–17.0)
MCHC: 34.4 g/dL (ref 30.0–36.0)
MCV: 89.2 fl (ref 78.0–100.0)
Platelets: 181 10*3/uL (ref 150.0–400.0)
RBC: 4.86 Mil/uL (ref 4.22–5.81)
RDW: 13 % (ref 11.5–15.5)
WBC: 8.1 10*3/uL (ref 4.0–10.5)

## 2022-04-22 LAB — PSA: PSA: 1.43 ng/mL (ref 0.10–4.00)

## 2022-05-07 ENCOUNTER — Other Ambulatory Visit (INDEPENDENT_AMBULATORY_CARE_PROVIDER_SITE_OTHER): Payer: BC Managed Care – PPO

## 2022-05-07 DIAGNOSIS — E785 Hyperlipidemia, unspecified: Secondary | ICD-10-CM

## 2022-05-07 DIAGNOSIS — N289 Disorder of kidney and ureter, unspecified: Secondary | ICD-10-CM | POA: Diagnosis not present

## 2022-05-08 ENCOUNTER — Encounter: Payer: Self-pay | Admitting: Family Medicine

## 2022-05-08 LAB — BASIC METABOLIC PANEL
BUN: 26 mg/dL — ABNORMAL HIGH (ref 6–23)
CO2: 29 mEq/L (ref 19–32)
Calcium: 9.3 mg/dL (ref 8.4–10.5)
Chloride: 100 mEq/L (ref 96–112)
Creatinine, Ser: 1.36 mg/dL (ref 0.40–1.50)
GFR: 63.67 mL/min (ref >60.00)
Glucose, Bld: 74 mg/dL (ref 70–99)
Potassium: 3.7 mEq/L (ref 3.5–5.1)
Sodium: 140 mEq/L (ref 135–145)

## 2022-05-08 LAB — LIPID PANEL
Cholesterol: 227 mg/dL — ABNORMAL HIGH (ref 0–200)
HDL: 58.7 mg/dL (ref >39.00)
LDL Cholesterol: 146 mg/dL — ABNORMAL HIGH (ref 0–99)
NonHDL: 168.39
Total CHOL/HDL Ratio: 4
Triglycerides: 112 mg/dL (ref 0.0–149.0)
VLDL: 22.4 mg/dL (ref 0.0–40.0)

## 2022-07-07 DIAGNOSIS — Z808 Family history of malignant neoplasm of other organs or systems: Secondary | ICD-10-CM | POA: Diagnosis not present

## 2022-07-07 DIAGNOSIS — D2272 Melanocytic nevi of left lower limb, including hip: Secondary | ICD-10-CM | POA: Diagnosis not present

## 2022-07-07 DIAGNOSIS — L814 Other melanin hyperpigmentation: Secondary | ICD-10-CM | POA: Diagnosis not present

## 2022-07-07 DIAGNOSIS — L821 Other seborrheic keratosis: Secondary | ICD-10-CM | POA: Diagnosis not present

## 2023-06-10 IMAGING — MR MR SHOULDER*R* W/O CM
5 series · 40 of 40 positions shown · non-contrast
Comparison: Right shoulder radiographs 04/23/2021

CLINICAL DATA: Shoulder pain.  Rotator cuff disorder suspected.

EXAM:
MRI OF THE RIGHT SHOULDER WITHOUT CONTRAST
TECHNIQUE: Multiplanar, multisequence MR imaging of the shoulder was performed.
No intravenous contrast was administered.

[Series 3: T2 fat-sat · axial · 4.0mm · 0.55mm/px · z∈[-63,+47]mm · 8 of 24 slices shown (1 of 3)]
[im 1/24]
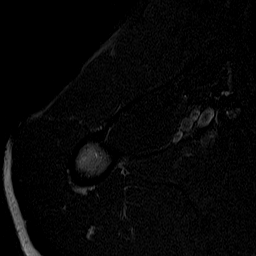
[im 4/24]
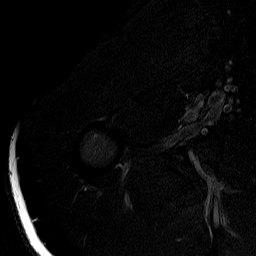
[im 7/24]
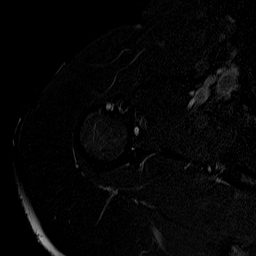
[im 10/24]
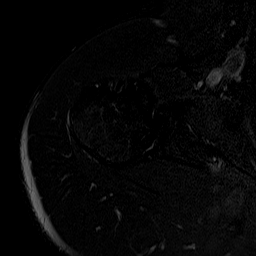
[im 14/24]
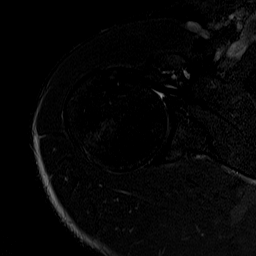
[im 17/24]
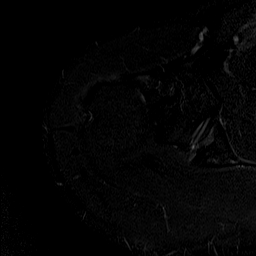
[im 20/24]
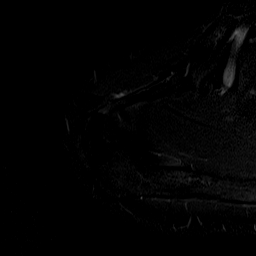
[im 24/24]
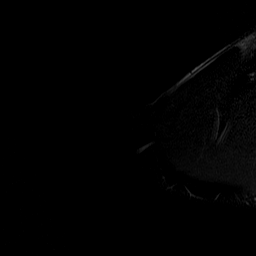

[Series 4: T2 fat-sat · oblique · 4.0mm · 0.55mm/px · 8 of 20 slices shown (2 of 3)]
[im 1/20]
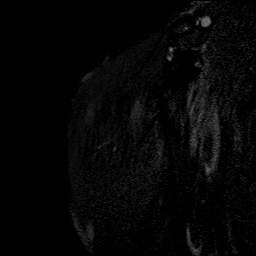
[im 3/20]
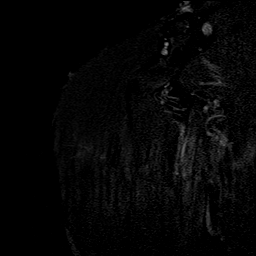
[im 6/20]
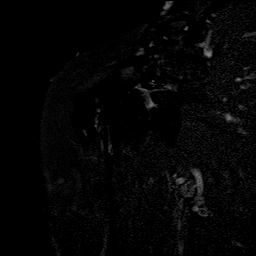
[im 9/20]
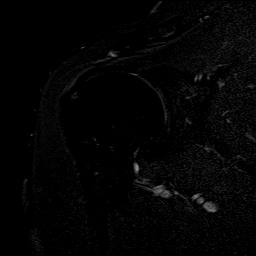
[im 11/20]
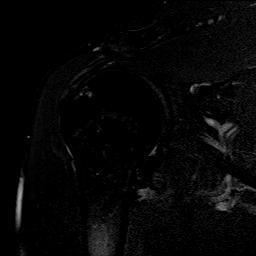
[im 14/20]
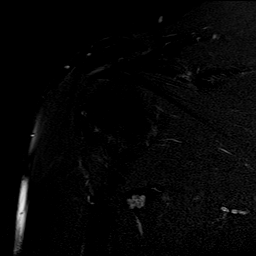
[im 17/20]
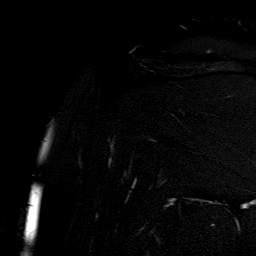
[im 20/20]
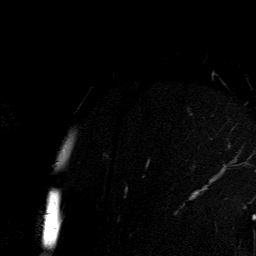

[Series 5: PD · oblique · 4.0mm · 0.55mm/px · 8 of 20 slices shown]
[im 1/20]
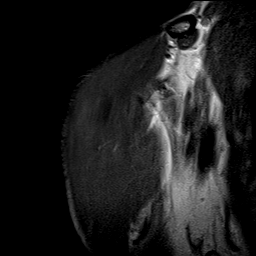
[im 3/20]
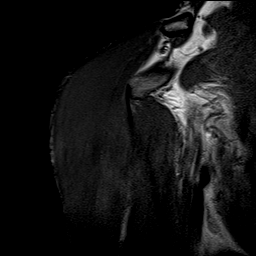
[im 6/20]
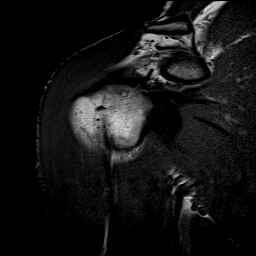
[im 9/20]
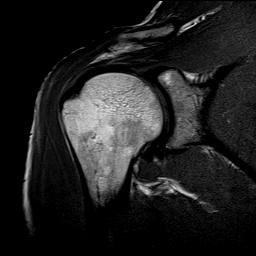
[im 11/20]
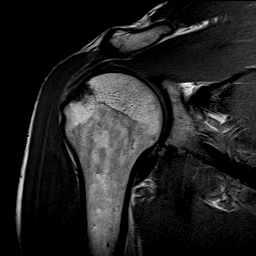
[im 14/20]
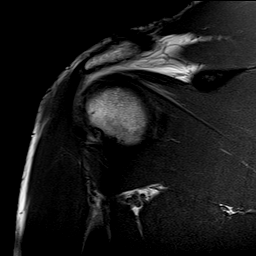
[im 17/20]
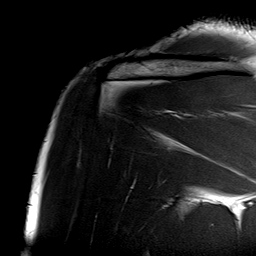
[im 20/20]
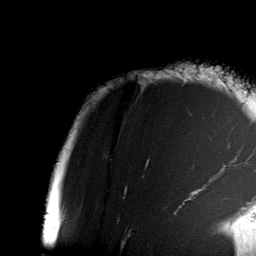

[Series 6: T2 fat-sat · oblique · 4.0mm · 0.55mm/px · 8 of 20 slices shown (3 of 3)]
[im 1/20]
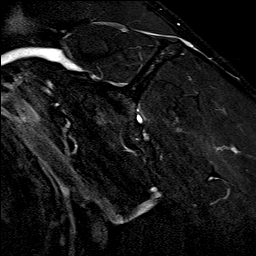
[im 3/20]
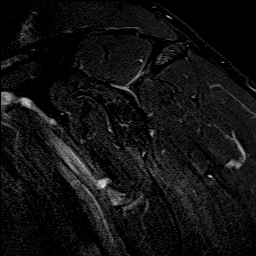
[im 6/20]
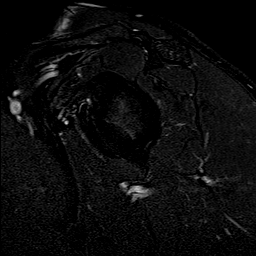
[im 9/20]
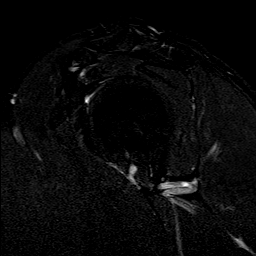
[im 11/20]
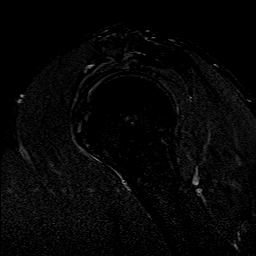
[im 14/20]
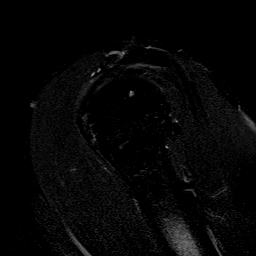
[im 17/20]
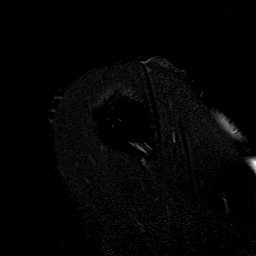
[im 20/20]
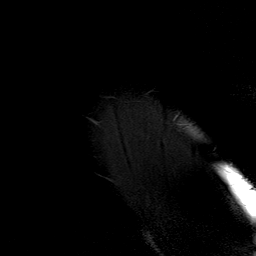

[Series 7: T1 · oblique · 4.0mm · 0.55mm/px · 8 of 20 slices shown]
[im 1/20]
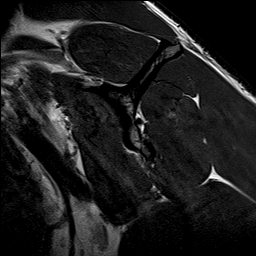
[im 3/20]
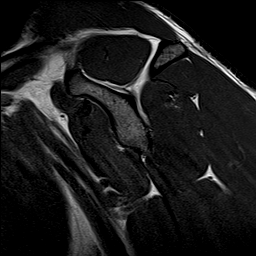
[im 6/20]
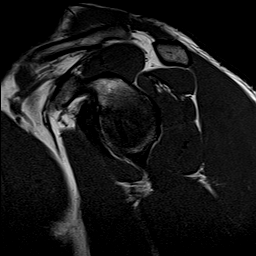
[im 9/20]
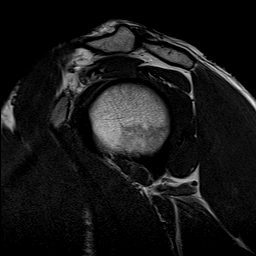
[im 11/20]
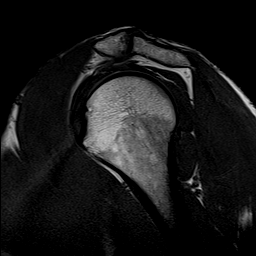
[im 14/20]
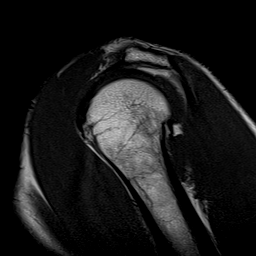
[im 17/20]
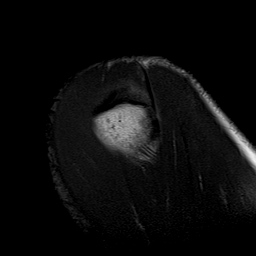
[im 20/20]
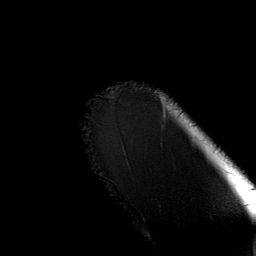

[40 of 40 positions shown; findings below may reference images not displayed]

FINDINGS: Rotator cuff: There is moderate subcortical cystic change within the
humeral head deep to the anterior infraspinatus and posterior
supraspinatus tendon fibers. Moderate infraspinatus greater than
supraspinatus intermediate T2 signal tendinosis in this region with
a tiny midsubstance partial-thickness tear measuring up to 2 mm in
AP dimension (sagittal series 6, image 16, 4 mm in transverse
dimension, and 10 mm along the longitudinal length of the tendon
(coronal series 4, image 9). No tendon retraction. The subscapularis
and teres minor are intact.

Muscles: No rotator cuff muscle atrophy, fatty infiltration, or
edema.

Biceps long head: The intra-articular long head of the biceps tendon
is intact.

Acromioclavicular Joint: There are mild degenerative changes of the
acromioclavicular joint including joint space narrowing and
peripheral osteophytosis. Type II acromion.

Mild edema but no frank fluid within the subacromial/subdeltoid
bursa.

Glenohumeral Joint: Mild glenoid and humeral head cartilage
thinning.

Labrum: Grossly intact, but evaluation is limited by lack of
intraarticular fluid.

Bones:  No acute fracture.

Other: None.
IMPRESSION: 1. Moderate posterior supraspinatus and anterior infraspinatus
tendinosis with associated midsubstance tiny partial-thickness
tendon tear but no tendon retraction.
2. Mild degenerative changes of the acromioclavicular joint.

## 2023-07-09 DIAGNOSIS — Z808 Family history of malignant neoplasm of other organs or systems: Secondary | ICD-10-CM | POA: Diagnosis not present

## 2023-07-09 DIAGNOSIS — D229 Melanocytic nevi, unspecified: Secondary | ICD-10-CM | POA: Diagnosis not present

## 2023-07-09 DIAGNOSIS — L814 Other melanin hyperpigmentation: Secondary | ICD-10-CM | POA: Diagnosis not present

## 2023-10-06 ENCOUNTER — Encounter: Payer: Self-pay | Admitting: Family Medicine

## 2023-10-06 ENCOUNTER — Ambulatory Visit (INDEPENDENT_AMBULATORY_CARE_PROVIDER_SITE_OTHER): Admitting: Family Medicine

## 2023-10-06 ENCOUNTER — Ambulatory Visit: Payer: Self-pay | Admitting: Family Medicine

## 2023-10-06 VITALS — BP 130/78 | HR 64 | Temp 98.0°F | Resp 16 | Ht 73.0 in | Wt 168.4 lb

## 2023-10-06 DIAGNOSIS — Z0000 Encounter for general adult medical examination without abnormal findings: Secondary | ICD-10-CM | POA: Diagnosis not present

## 2023-10-06 DIAGNOSIS — Z1211 Encounter for screening for malignant neoplasm of colon: Secondary | ICD-10-CM | POA: Diagnosis not present

## 2023-10-06 DIAGNOSIS — Z125 Encounter for screening for malignant neoplasm of prostate: Secondary | ICD-10-CM

## 2023-10-06 LAB — COMPREHENSIVE METABOLIC PANEL WITH GFR
ALT: 20 U/L (ref 0–53)
AST: 22 U/L (ref 0–37)
Albumin: 4.7 g/dL (ref 3.5–5.2)
Alkaline Phosphatase: 41 U/L (ref 39–117)
BUN: 23 mg/dL (ref 6–23)
CO2: 29 meq/L (ref 19–32)
Calcium: 9.4 mg/dL (ref 8.4–10.5)
Chloride: 100 meq/L (ref 96–112)
Creatinine, Ser: 1.46 mg/dL (ref 0.40–1.50)
GFR: 57.89 mL/min — ABNORMAL LOW (ref >60.00)
Glucose, Bld: 94 mg/dL (ref 70–99)
Potassium: 4.3 meq/L (ref 3.5–5.1)
Sodium: 138 meq/L (ref 135–145)
Total Bilirubin: 0.6 mg/dL (ref 0.2–1.2)
Total Protein: 7 g/dL (ref 6.0–8.3)

## 2023-10-06 LAB — CBC
HCT: 43.9 % (ref 39.0–52.0)
Hemoglobin: 14.8 g/dL (ref 13.0–17.0)
MCHC: 33.8 g/dL (ref 30.0–36.0)
MCV: 88.9 fl (ref 78.0–100.0)
Platelets: 146 K/uL — ABNORMAL LOW (ref 150.0–400.0)
RBC: 4.94 Mil/uL (ref 4.22–5.81)
RDW: 12.9 % (ref 11.5–15.5)
WBC: 7.3 K/uL (ref 4.0–10.5)

## 2023-10-06 LAB — LIPID PANEL
Cholesterol: 260 mg/dL — ABNORMAL HIGH (ref 0–200)
HDL: 71.9 mg/dL (ref >39.00)
LDL Cholesterol: 166 mg/dL — ABNORMAL HIGH (ref 0–99)
NonHDL: 187.89
Total CHOL/HDL Ratio: 4
Triglycerides: 107 mg/dL (ref 0.0–149.0)
VLDL: 21.4 mg/dL (ref 0.0–40.0)

## 2023-10-06 LAB — PSA: PSA: 1.56 ng/mL (ref 0.10–4.00)

## 2023-10-06 LAB — HEPATITIS B SURFACE ANTIBODY, QUANTITATIVE: Hep B S AB Quant (Post): 12 m[IU]/mL (ref >=10)

## 2023-10-06 NOTE — Progress Notes (Signed)
 Chief Complaint  Patient presents with   Annual Exam    CPE     Well Male Tyrone Preston is here for a complete physical.   His last physical was >1 year ago.  Current diet: in general, a healthy diet.   Current exercise: running, wt lifting Weight trend: intentionally losing Fatigue out of ordinary? No. Seat belt? Yes.   Advanced directive? No  Health maintenance Tetanus- Yes HIV- Yes Hep C- Yes CCS- No  Past Medical History:  Diagnosis Date   No known health problems      Past Surgical History:  Procedure Laterality Date   VASECTOMY     WISDOM TOOTH EXTRACTION      Medications  Takes no meds routinely.   Allergies No Known Allergies  Family History Family History  Problem Relation Age of Onset   Hyperlipidemia Mother    Diabetes Mother    Cancer Father        prostate cancer   Cancer Paternal Grandfather        prostate cancer   Cancer Paternal Uncle         prostate cancer    Review of Systems: Constitutional: no fevers or chills Eye:  no recent significant change in vision Ear/Nose/Mouth/Throat:  Ears:  no hearing loss Nose/Mouth/Throat:  no complaints of nasal congestion, no sore throat Cardiovascular:  no chest pain Respiratory:  no shortness of breath Gastrointestinal:  no abdominal pain, no change in bowel habits GU:  Male: negative for dysuria, frequency, and incontinence Musculoskeletal/Extremities:  no pain of the joints Integumentary (Skin/Breast):  no abnormal skin lesions reported Neurologic:  no headaches Endocrine: No unexpected weight changes Hematologic/Lymphatic:  no night sweats  Exam BP 130/78 (BP Location: Left Arm, Patient Position: Sitting)   Pulse 64   Temp 98 F (36.7 C) (Oral)   Resp 16   Ht 6' 1 (1.854 m)   Wt 168 lb 6.4 oz (76.4 kg)   SpO2 95%   BMI 22.22 kg/m  General:  well developed, well nourished, in no apparent distress Skin:  no significant moles, warts, or growths Head:  no masses, lesions, or  tenderness Eyes:  pupils equal and round, sclera anicteric without injection Ears:  canals without lesions, TMs shiny without retraction, no obvious effusion, no erythema Nose:  nares patent, mucosa normal Throat/Pharynx:  lips and gingiva without lesion; tongue and uvula midline; non-inflamed pharynx; no exudates or postnasal drainage Neck: neck supple without adenopathy, thyromegaly, or masses Lungs:  clear to auscultation, breath sounds equal bilaterally, no respiratory distress Cardio:  regular rate and rhythm, no bruits, no LE edema Abdomen:  abdomen soft, nontender; bowel sounds normal; no masses or organomegaly Rectal: Deferred Musculoskeletal:  symmetrical muscle groups noted without atrophy or deformity Extremities:  no clubbing, cyanosis, or edema, no deformities, no skin discoloration Neuro:  gait normal; deep tendon reflexes normal and symmetric Psych: well oriented with normal range of affect and appropriate judgment/insight  Assessment and Plan  Well adult exam - Plan: CBC, Comprehensive metabolic panel with GFR, Lipid panel, Hepatitis B surface antibody,quantitative  Screening for prostate cancer - Plan: PSA  Screen for colon cancer - Plan: Ambulatory referral to Gastroenterology   Well 43 y.o. male. Counseled on diet and exercise. Counseled on risks and benefits of prostate cancer screening with PSA. The patient agrees to undergo screening.  Advanced directive form provided today.  Refer GI for CCS.  Other orders as above. Follow up in 1 yr pending the above  workup. The patient voiced understanding and agreement to the plan.  Mabel Mt [place], DO 10/06/23 11:23 AM

## 2023-10-06 NOTE — Patient Instructions (Signed)
 Give [place] 2-3 business days to get the results of your labs back.  ? ?Keep the diet clean and stay active. ? ?Please get me a copy of your advanced directive form at your convenience.  ? ?If you do not hear anything about your referral in the next 1-2 weeks, call our office and ask for an update. ? ?Let us know if you need anything. ?

## 2023-11-24 ENCOUNTER — Encounter: Payer: Self-pay | Admitting: Pediatrics

## 2023-12-28 ENCOUNTER — Ambulatory Visit

## 2023-12-28 VITALS — Ht 73.0 in | Wt 165.0 lb

## 2023-12-28 DIAGNOSIS — Z1211 Encounter for screening for malignant neoplasm of colon: Secondary | ICD-10-CM

## 2023-12-28 MED ORDER — NA SULFATE-K SULFATE-MG SULF 17.5-3.13-1.6 GM/177ML PO SOLN: 1.0000 | Freq: Once | ORAL | 0 refills | Status: AC

## 2023-12-28 NOTE — Progress Notes (Signed)

## 2024-01-01 ENCOUNTER — Encounter: Payer: Self-pay | Admitting: Pediatrics

## 2024-01-10 NOTE — Progress Notes (Unsigned)
 [facility] History and Physical   Primary Care Physician:  Frann Mabel Mt, DO   Reason for Procedure:  Colorectal cancer screening  Plan:               Colonoscopy     HPI: Tyrone Preston is a 43 y.o. male undergoing colonoscopy for colorectal cancer screening.  This is the patient's first colonoscopy.  No family history of colorectal cancer or polyps.  Patient denies current symptoms of rectal bleeding or change in bowel habits.   Past Medical History:  Diagnosis Date   No known health problems     Past Surgical History:  Procedure Laterality Date   VASECTOMY     WISDOM TOOTH EXTRACTION      Prior to Admission medications   Not on File    No current outpatient medications on file.   No current facility-administered medications for this visit.    Allergies as of 01/11/2024   (No Known Allergies)    Family History  Problem Relation Age of Onset   Hyperlipidemia Mother    Diabetes Mother    Cancer Father        prostate cancer   Cancer Paternal Uncle         prostate cancer   Cancer Paternal Grandfather        prostate cancer   Colon cancer Neg Hx    Esophageal cancer Neg Hx    Stomach cancer Neg Hx    Rectal cancer Neg Hx     Social History   Socioeconomic History   Marital status: Married    Spouse name: Not on file   Number of children: Not on file   Years of education: Not on file   Highest education level: Not on file  Occupational History   Not on file  Tobacco Use   Smoking status: Never   Smokeless tobacco: Never  Vaping Use   Vaping status: Never Used  Substance and Sexual Activity   Alcohol use: Yes    Comment: one or two per week   Drug use: Never   Sexual activity: Yes    Partners: Female    Comment: VASECTOMY  Other Topics Concern   Not on file  Social History Narrative   Not on file   Social Drivers of Health   Financial Resource Strain: Not on file  Food Insecurity: Not on file  Transportation  Needs: Not on file  Physical Activity: Not on file  Stress: Not on file  Social Connections: Not on file  Intimate Partner Violence: Not on file    Review of Systems:  All other review of systems negative except as mentioned in the HPI.  Physical Exam: Vital signs There were no vitals taken for this visit.  General:   Alert,  Well-developed, well-nourished, pleasant and cooperative in NAD Airway:  Mallampati  Lungs:  Clear throughout to auscultation.   Heart:  Regular rate and rhythm; no murmurs, clicks, rubs,  or gallops. Abdomen:  Soft, nontender and nondistended. Normal bowel sounds.   Neuro/Psych:  Normal mood and affect. A and O x 3  Inocente Hausen, MD [facility]

## 2024-01-11 ENCOUNTER — Ambulatory Visit (AMBULATORY_SURGERY_CENTER): Admitting: Pediatrics

## 2024-01-11 ENCOUNTER — Encounter: Payer: Self-pay | Admitting: Pediatrics

## 2024-01-11 VITALS — BP 99/51 | HR 47 | Temp 98.1°F | Resp 17 | Ht 73.0 in | Wt 165.0 lb

## 2024-01-11 DIAGNOSIS — K648 Other hemorrhoids: Secondary | ICD-10-CM

## 2024-01-11 DIAGNOSIS — K644 Residual hemorrhoidal skin tags: Secondary | ICD-10-CM

## 2024-01-11 DIAGNOSIS — Z1211 Encounter for screening for malignant neoplasm of colon: Secondary | ICD-10-CM | POA: Diagnosis not present

## 2024-01-11 MED ORDER — SODIUM CHLORIDE 0.9 % IV SOLN: 500.0000 mL | INTRAVENOUS | Status: DC

## 2024-01-11 NOTE — Progress Notes (Signed)
 Pt's states no medical or surgical changes since previsit or office visit.

## 2024-01-11 NOTE — Patient Instructions (Signed)

## 2024-01-11 NOTE — Progress Notes (Signed)
 Transferred to PACU via stretcher. Patient arousing to stimulation.  VSS upon leaving procedure room.

## 2024-01-11 NOTE — Op Note (Signed)
 [facility] Patient Name: Tyrone Preston Procedure Date: 01/11/2024 11:26 AM MRN: [id number] Endoscopist: Inocente Hausen , MD, [id number] Age: 43 Referring MD:  Date of Birth: 10-19-1980 Gender: Male Account #: [id number] Procedure:                Colonoscopy Indications:              Screening for colorectal malignant neoplasm, This                            is the patient's first colonoscopy Medicines:                Monitored Anesthesia Care Procedure:                Pre-Anesthesia Assessment:                           - Prior to the procedure, a History and Physical                            was performed, and patient medications and                            allergies were reviewed. The patient's tolerance of                            previous anesthesia was also reviewed. The risks                            and benefits of the procedure and the sedation                            options and risks were discussed with the patient.                            All questions were answered, and informed consent                            was obtained. Prior Anticoagulants: The patient has                            taken no anticoagulant or antiplatelet agents. ASA                            Grade Assessment: I - A normal, healthy patient.                            After reviewing the risks and benefits, the patient                            was deemed in satisfactory condition to undergo the                            procedure.  After obtaining informed consent, the colonoscope                            was passed under direct vision. Throughout the                            procedure, the patient's blood pressure, pulse, and                            oxygen saturations were monitored continuously. The                            CF [id number] #[id number] was introduced through the anus                            and advanced to the cecum,  identified by                            appendiceal orifice and ileocecal valve. The                            colonoscopy was performed without difficulty. The                            patient tolerated the procedure well. The quality                            of the bowel preparation was good. The ileocecal                            valve, appendiceal orifice, and rectum were                            photographed. Scope In: 11:37:24 AM Scope Out: 11:56:48 AM Scope Withdrawal Time: 0 hours 14 minutes 24 seconds  Total Procedure Duration: 0 hours 19 minutes 24 seconds  Findings:                 Skin tags were found on perianal exam.                           The digital rectal exam was normal. Pertinent                            negatives include normal sphincter tone and no                            palpable rectal lesions.                           The colon (entire examined portion) appeared normal.                           Internal hemorrhoids were found during retroflexion. Complications:            No  immediate complications. Estimated blood loss:                            Minimal. Estimated Blood Loss:     Estimated blood loss was minimal. Impression:               - Perianal skin tags found on perianal exam.                           - The entire examined colon is normal.                           - Internal hemorrhoids.                           - No specimens collected. Recommendation:           - Discharge patient to home (ambulatory).                           - Repeat colonoscopy in 10 years for screening                            purposes.                           - The findings and recommendations were discussed                            with the patient's family.                           - Return to referring physician.                           - Patient has a contact number available for                            emergencies. The signs and symptoms of  potential                            delayed complications were discussed with the                            patient. Return to normal activities tomorrow.                            Written discharge instructions were provided to the                            patient. Inocente Hausen, MD 01/11/2024 12:00:54 PM This report has been signed electronically.

## 2024-01-12 ENCOUNTER — Telehealth: Payer: Self-pay

## 2024-01-12 NOTE — Telephone Encounter (Signed)
  Follow up Call-     01/11/2024   10:29 AM  Call back number  Post procedure Call Back phone  # [phone]  Permission to leave phone message Yes     Patient questions:  Do you have a fever, pain , or abdominal swelling? No. Pain Score  0 *  Have you tolerated food without any problems? Yes.    Have you been able to return to your normal activities? Yes.    Do you have any questions about your discharge instructions: Diet   No. Medications  No. Follow up visit  No.  Do you have questions or concerns about your Care? No.  Actions: * If pain score is 4 or above: No action needed, pain <4.

## 2024-10-10 ENCOUNTER — Encounter: Admitting: Family Medicine
